# Patient Record
Sex: Female | Born: 1995 | Race: White | Hispanic: No | Marital: Married | Smoking: Never smoker
Health system: Southern US, Community
[De-identification: ages and names within clinical notes are randomized; demographics above are authoritative.]

## PROBLEM LIST (undated history)

## (undated) DIAGNOSIS — Z789 Other specified health status: Secondary | ICD-10-CM

## (undated) HISTORY — PX: NO PAST SURGERIES: SHX2092

---
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
## 2014-03-24 ENCOUNTER — Emergency Department: Payer: Self-pay | Admitting: Emergency Medicine

## 2014-03-24 LAB — BASIC METABOLIC PANEL
Anion Gap: 6 — ABNORMAL LOW (ref 7–16)
BUN: 15 mg/dL (ref 9–21)
CHLORIDE: 107 mmol/L (ref 97–107)
CO2: 26 mmol/L — AB (ref 16–25)
CREATININE: 0.77 mg/dL (ref 0.60–1.30)
Calcium, Total: 9.1 mg/dL (ref 9.0–10.7)
EGFR (Non-African Amer.): >60
Glucose: 96 mg/dL (ref 65–99)
OSMOLALITY: 278 (ref 275–301)
POTASSIUM: 3.8 mmol/L (ref 3.3–4.7)
Sodium: 139 mmol/L (ref 132–141)

## 2014-03-24 LAB — CBC WITH DIFFERENTIAL/PLATELET
Basophil #: 0.1 10*3/uL (ref 0.0–0.1)
Basophil %: 0.4 %
EOS ABS: 0.1 10*3/uL (ref 0.0–0.7)
EOS PCT: 0.5 %
HCT: 41.7 % (ref 35.0–47.0)
HGB: 14 g/dL (ref 12.0–16.0)
LYMPHS PCT: 14.3 %
Lymphocyte #: 1.9 10*3/uL (ref 1.0–3.6)
MCH: 29.7 pg (ref 26.0–34.0)
MCHC: 33.6 g/dL (ref 32.0–36.0)
MCV: 88 fL (ref 80–100)
MONO ABS: 0.5 x10 3/mm (ref 0.2–0.9)
MONOS PCT: 3.9 %
NEUTROS ABS: 10.6 10*3/uL — AB (ref 1.4–6.5)
Neutrophil %: 80.9 %
PLATELETS: 256 10*3/uL (ref 150–440)
RBC: 4.72 10*6/uL (ref 3.80–5.20)
RDW: 13.4 % (ref 11.5–14.5)
WBC: 13.1 10*3/uL — AB (ref 3.6–11.0)

## 2014-03-24 LAB — URINALYSIS, COMPLETE
BACTERIA: NEGATIVE
BILIRUBIN, UR: NEGATIVE
Glucose,UR: NEGATIVE mg/dL (ref 0–75)
Ketone: NEGATIVE
Leukocyte Esterase: NEGATIVE
NITRITE: NEGATIVE
Ph: 6 (ref 4.5–8.0)
Protein: NEGATIVE
SPECIFIC GRAVITY: 1.02 (ref 1.003–1.030)

## 2014-04-11 ENCOUNTER — Emergency Department: Payer: Self-pay | Admitting: Emergency Medicine

## 2014-04-11 LAB — URINALYSIS, COMPLETE
BILIRUBIN, UR: NEGATIVE
Bacteria: NONE SEEN
Blood: NEGATIVE
Glucose,UR: NEGATIVE mg/dL (ref 0–75)
Ketone: NEGATIVE
Leukocyte Esterase: NEGATIVE
NITRITE: NEGATIVE
Ph: 7 (ref 4.5–8.0)
Protein: NEGATIVE
Specific Gravity: 1.005 (ref 1.003–1.030)
WBC UR: 1 /HPF (ref 0–5)

## 2014-04-11 LAB — CBC WITH DIFFERENTIAL/PLATELET
Basophil #: 0 10*3/uL (ref 0.0–0.1)
Basophil %: 0.3 %
EOS ABS: 0 10*3/uL (ref 0.0–0.7)
EOS PCT: 0.3 %
HCT: 38.4 % (ref 35.0–47.0)
HGB: 13.1 g/dL (ref 12.0–16.0)
LYMPHS ABS: 0.7 10*3/uL — AB (ref 1.0–3.6)
LYMPHS PCT: 11.6 %
MCH: 29.3 pg (ref 26.0–34.0)
MCHC: 34 g/dL (ref 32.0–36.0)
MCV: 86 fL (ref 80–100)
MONO ABS: 0.4 x10 3/mm (ref 0.2–0.9)
MONOS PCT: 7.3 %
NEUTROS ABS: 4.9 10*3/uL (ref 1.4–6.5)
NEUTROS PCT: 80.5 %
Platelet: 173 10*3/uL (ref 150–440)
RBC: 4.46 10*6/uL (ref 3.80–5.20)
RDW: 12.9 % (ref 11.5–14.5)
WBC: 6.1 10*3/uL (ref 3.6–11.0)

## 2014-04-11 LAB — BASIC METABOLIC PANEL
ANION GAP: 7 (ref 7–16)
BUN: 9 mg/dL (ref 9–21)
CALCIUM: 9 mg/dL (ref 9.0–10.7)
CO2: 26 mmol/L — AB (ref 16–25)
Chloride: 100 mmol/L (ref 97–107)
Creatinine: 0.61 mg/dL (ref 0.60–1.30)
EGFR (African American): >60
GLUCOSE: 113 mg/dL — AB (ref 65–99)
Osmolality: 266 (ref 275–301)
POTASSIUM: 3.5 mmol/L (ref 3.3–4.7)
Sodium: 133 mmol/L (ref 132–141)

## 2014-04-13 ENCOUNTER — Emergency Department: Payer: Self-pay | Admitting: Internal Medicine

## 2014-11-17 ENCOUNTER — Emergency Department: Payer: Self-pay | Admitting: Emergency Medicine

## 2014-11-20 ENCOUNTER — Emergency Department: Payer: Self-pay | Admitting: Emergency Medicine

## 2014-12-04 ENCOUNTER — Emergency Department: Payer: Self-pay | Admitting: Emergency Medicine

## 2015-01-08 ENCOUNTER — Emergency Department: Payer: Self-pay | Admitting: Emergency Medicine

## 2015-01-11 ENCOUNTER — Emergency Department: Payer: Self-pay | Admitting: Emergency Medicine

## 2016-03-06 ENCOUNTER — Ambulatory Visit
Admission: EM | Admit: 2016-03-06 | Discharge: 2016-03-06 | Disposition: A | Discharge status: Home / Self Care | Payer: BLUE CROSS/BLUE SHIELD | Attending: Family Medicine | Admitting: Family Medicine

## 2016-03-06 ENCOUNTER — Encounter: Payer: Self-pay | Admitting: *Deleted

## 2016-03-06 DIAGNOSIS — H109 Unspecified conjunctivitis: Secondary | ICD-10-CM | POA: Diagnosis not present

## 2016-03-06 MED ORDER — GENTAMICIN SULFATE 0.3 % OP SOLN: 2.0000 [drp] | Freq: Four times a day (QID) | OPHTHALMIC | Status: DC

## 2016-03-06 NOTE — ED Notes (Signed)
Awoke with left eye matted this am. Left eye is itching and red. Pt states had pink eye a year ago and "this feels like pink eye".

## 2016-03-06 NOTE — Discharge Instructions (Signed)

## 2016-03-06 NOTE — ED Provider Notes (Signed)
CSN: [id number]     Arrival date & time 03/06/16  1505 History   First MD Initiated Contact with Patient 03/06/16 1619     Chief Complaint  Patient presents with  . Eye Drainage   HPI  Nichole Little is a pleasant 20 y.o. female who presents with acute red left eye. She states yesterday around 5 PM she noticed her left eye redness. She denies any known injury or visual changes. She denies any ill contacts. It is itchy. She did have yellow crusting within this point. She denies any sneezing, sinus pressure, personal drip, ear pain or fever.  History reviewed. No pertinent past medical history. History reviewed. No pertinent past surgical history. History reviewed. No pertinent family history. Social History  Substance Use Topics  . Smoking status: Never Smoker   . Smokeless tobacco: None  . Alcohol Use: No   OB History    No data available     Review of Systems  Constitutional: Negative.   HENT: Negative.   Eyes: Positive for pain, discharge, redness and itching. Negative for photophobia and visual disturbance.  Respiratory: Negative.   Cardiovascular: Negative.   Gastrointestinal: Negative.   Genitourinary: Negative.   Musculoskeletal: Negative.   Skin: Negative.   Neurological: Negative.   Psychiatric/Behavioral: Negative.     Allergies  Benadryl and Metronidazole and related  Home Medications   Prior to Admission medications   Medication Sig Start Date End Date Taking? Authorizing Provider  ibuprofen (ADVIL,MOTRIN) 200 MG tablet Take 600 mg by mouth once as needed for pain.   Yes Historical Provider, MD  etonogestrel-ethinyl estradiol (NUVARING) 0.12-0.015 MG/24HR vaginal ring Place 1 each vaginally every 28 (twenty-eight) days. Insert vaginally and leave in place for 3 consecutive weeks, then remove for 1 week.    Historical Provider, MD  gentamicin (GARAMYCIN) 0.3 % ophthalmic solution Place 2 drops into the left eye 4 (four) times daily. 03/06/16   Joselyn ArrowKandice L Berl Bonfanti,  NP   Meds Ordered and Administered this Visit  Medications - No data to display  BP 110/59 mmHg  Pulse 54  Temp(Src) 97.9 F (36.6 C) (Oral)  Resp 16  Ht 5\' 3"  (1.6 m)  Wt 145 lb (65.772 kg)  BMI 25.69 kg/m2  SpO2 100% No data found.   Physical Exam  Constitutional: She is oriented to person, place, and time. She appears well-developed and well-nourished. No distress.  HENT:  Head: Normocephalic and atraumatic.  Eyes: Lids are normal. Pupils are equal, round, and reactive to light. Left conjunctiva is injected. No scleral icterus.    Neck: Normal range of motion.  Cardiovascular: Normal rate and regular rhythm.   Pulmonary/Chest: Effort normal and breath sounds normal. No respiratory distress.  Abdominal: Soft. Bowel sounds are normal. She exhibits no distension.  Musculoskeletal: Normal range of motion. She exhibits no edema or tenderness.  Neurological: She is alert and oriented to person, place, and time. No cranial nerve deficit.  Skin: Skin is warm and dry. No rash noted. No erythema.  Psychiatric: Her behavior is normal. Judgment normal.  Nursing note and vitals reviewed.   ED Course  Procedures n/a Visual Acuity Review  Right Eye Distance: 20/20 Left Eye Distance: 20/50 Bilateral Distance: 20/20  Right Eye Near:   Left Eye Near:    Bilateral Near:     MDM   1. Conjunctivitis of left eye    Plan: diagnosis reviewed with patient/parent/guardian/caregiver  Rx as per orders;  benefits, risks, potential side effects reviewed  Recommend  supportive treatment with Infection and spread precautions Seek additional medical care if symptoms worsen or are not improving     Joselyn Arrow, NP 03/06/16 1635

## 2016-04-17 ENCOUNTER — Emergency Department
Admission: EM | Admit: 2016-04-17 | Discharge: 2016-04-17 | Disposition: A | Discharge status: Home / Self Care | Payer: BLUE CROSS/BLUE SHIELD | Attending: Student | Admitting: Student

## 2016-04-17 ENCOUNTER — Encounter: Payer: Self-pay | Admitting: Emergency Medicine

## 2016-04-17 DIAGNOSIS — Y929 Unspecified place or not applicable: Secondary | ICD-10-CM | POA: Diagnosis not present

## 2016-04-17 DIAGNOSIS — Y999 Unspecified external cause status: Secondary | ICD-10-CM | POA: Diagnosis not present

## 2016-04-17 DIAGNOSIS — S39012A Strain of muscle, fascia and tendon of lower back, initial encounter: Secondary | ICD-10-CM | POA: Diagnosis not present

## 2016-04-17 DIAGNOSIS — Y939 Activity, unspecified: Secondary | ICD-10-CM | POA: Diagnosis not present

## 2016-04-17 DIAGNOSIS — X501XXA Overexertion from prolonged static or awkward postures, initial encounter: Secondary | ICD-10-CM | POA: Insufficient documentation

## 2016-04-17 DIAGNOSIS — M545 Low back pain: Secondary | ICD-10-CM | POA: Diagnosis present

## 2016-04-17 LAB — URINALYSIS COMPLETE WITH MICROSCOPIC (ARMC ONLY)
BILIRUBIN URINE: NEGATIVE
GLUCOSE, UA: NEGATIVE mg/dL
HGB URINE DIPSTICK: NEGATIVE
KETONES UR: NEGATIVE mg/dL
LEUKOCYTES UA: NEGATIVE
Nitrite: NEGATIVE
PH: 6 (ref 5.0–8.0)
Protein, ur: NEGATIVE mg/dL
Specific Gravity, Urine: 1.019 (ref 1.005–1.030)

## 2016-04-17 MED ORDER — CYCLOBENZAPRINE HCL 5 MG PO TABS: 5.0000 mg | ORAL_TABLET | Freq: Three times a day (TID) | ORAL | Status: DC | PRN

## 2016-04-17 MED ORDER — KETOROLAC TROMETHAMINE 60 MG/2ML IM SOLN
30.0000 mg | Freq: Once | INTRAMUSCULAR | Status: AC
  Administered 2016-04-17: 30 mg via INTRAMUSCULAR
  Filled 2016-04-17: qty 2

## 2016-04-17 MED ORDER — KETOROLAC TROMETHAMINE 10 MG PO TABS: 10.0000 mg | ORAL_TABLET | Freq: Three times a day (TID) | ORAL | Status: DC

## 2016-04-17 NOTE — Discharge Instructions (Signed)
Low Back Sprain With Rehab A sprain is an injury in which a ligament is torn. The ligaments of the lower back are vulnerable to sprains. However, they are strong and require great force to be injured. These ligaments are important for stabilizing the spinal column. Sprains are classified into three categories. Grade 1 sprains cause pain, but the tendon is not lengthened. Grade 2 sprains include a lengthened ligament, due to the ligament being stretched or partially ruptured. With grade 2 sprains there is still function, although the function may be decreased. Grade 3 sprains involve a complete tear of the tendon or muscle, and function is usually impaired. SYMPTOMS   Severe pain in the lower back.  Sometimes, a feeling of a "pop," "snap," or tear, at the time of injury.  Tenderness and sometimes swelling at the injury site.  Uncommonly, bruising (contusion) within 48 hours of injury.  Muscle spasms in the back. CAUSES  Low back sprains occur when a force is placed on the ligaments that is greater than they can handle. Common causes of injury include:  Performing a stressful act while off-balance.  Repetitive stressful activities that involve movement of the lower back.  Direct hit (trauma) to the lower back. RISK INCREASES WITH:  Contact sports (football, wrestling).  Collisions (major skiing accidents).  Sports that require throwing or lifting (baseball, weightlifting).  Sports involving twisting of the spine (gymnastics, diving, tennis, golf).  Poor strength and flexibility.  Inadequate protection.  Previous back injury or surgery (especially fusion). PREVENTION  Wear properly fitted and padded protective equipment.  Warm up and stretch properly before activity.  Allow for adequate recovery between workouts.  Maintain physical fitness:  Strength, flexibility, and endurance.  Cardiovascular fitness.  Maintain a healthy body weight. PROGNOSIS  If treated properly,  low back sprains usually heal with non-surgical treatment. The length of time for healing depends on the severity of the injury.  RELATED COMPLICATIONS   Recurring symptoms, resulting in a chronic problem.  Chronic inflammation and pain in the low back.  Delayed healing or resolution of symptoms, especially if activity is resumed too soon.  Prolonged impairment.  Unstable or arthritic joints of the low back. TREATMENT  Treatment first involves the use of ice and medicine, to reduce pain and inflammation. The use of strengthening and stretching exercises may help reduce pain with activity. These exercises may be performed at home or with a therapist. Severe injuries may require referral to a therapist for further evaluation and treatment, such as ultrasound. Your caregiver may advise that you wear a back brace or corset, to help reduce pain and discomfort. Often, prolonged bed rest results in greater harm then benefit. Corticosteroid injections may be recommended. However, these should be reserved for the most serious cases. It is important to avoid using your back when lifting objects. At night, sleep on your back on a firm mattress, with a pillow placed under your knees. If non-surgical treatment is unsuccessful, surgery may be needed.  MEDICATION   If pain medicine is needed, nonsteroidal anti-inflammatory medicines (aspirin and ibuprofen), or other minor pain relievers (acetaminophen), are often advised.  Do not take pain medicine for 7 days before surgery.  Prescription pain relievers may be given, if your caregiver thinks they are needed. Use only as directed and only as much as you need.  Ointments applied to the skin may be helpful.  Corticosteroid injections may be given by your caregiver. These injections should be reserved for the most serious cases, because  they may only be given a certain number of times. HEAT AND COLD  Cold treatment (icing) should be applied for 10 to 15  minutes every 2 to 3 hours for inflammation and pain, and immediately after activity that aggravates your symptoms. Use ice packs or an ice massage.  Heat treatment may be used before performing stretching and strengthening activities prescribed by your caregiver, physical therapist, or athletic trainer. Use a heat pack or a warm water soak. SEEK MEDICAL CARE IF:   Symptoms get worse or do not improve in 2 to 4 weeks, despite treatment.  You develop numbness or weakness in either leg.  You lose bowel or bladder function.  Any of the following occur after surgery: fever, increased pain, swelling, redness, drainage of fluids, or bleeding in the affected area.  New, unexplained symptoms develop. (Drugs used in treatment may produce side effects.) EXERCISES  RANGE OF MOTION (ROM) AND STRETCHING EXERCISES - Low Back Sprain Most people with lower back pain will find that their symptoms get worse with excessive bending forward (flexion) or arching at the lower back (extension). The exercises that will help resolve your symptoms will focus on the opposite motion.  Your physician, physical therapist or athletic trainer will help you determine which exercises will be most helpful to resolve your lower back pain. Do not complete any exercises without first consulting with your caregiver. Discontinue any exercises which make your symptoms worse, until you speak to your caregiver. If you have pain, numbness or tingling which travels down into your buttocks, leg or foot, the goal of the therapy is for these symptoms to move closer to your back and eventually resolve. Sometimes, these leg symptoms will get better, but your lower back pain may worsen. This is often an indication of progress in your rehabilitation. Be very alert to any changes in your symptoms and the activities in which you participated in the 24 hours prior to the change. Sharing this information with your caregiver will allow him or her to most  efficiently treat your condition. These exercises may help you when beginning to rehabilitate your injury. Your symptoms may resolve with or without further involvement from your physician, physical therapist or athletic trainer. While completing these exercises, remember:   Restoring tissue flexibility helps normal motion to return to the joints. This allows healthier, less painful movement and activity.  An effective stretch should be held for at least 30 seconds.  A stretch should never be painful. You should only feel a gentle lengthening or release in the stretched tissue. FLEXION RANGE OF MOTION AND STRETCHING EXERCISES: STRETCH - Flexion, Single Knee to Chest   Lie on a firm bed or floor with both legs extended in front of you.  Keeping one leg in contact with the floor, bring your opposite knee to your chest. Hold your leg in place by either grabbing behind your thigh or at your knee.  Pull until you feel a gentle stretch in your low back. Hold __________ seconds.  Slowly release your grasp and repeat the exercise with the opposite side. Repeat __________ times. Complete this exercise __________ times per day.  STRETCH - Flexion, Double Knee to Chest  Lie on a firm bed or floor with both legs extended in front of you.  Keeping one leg in contact with the floor, bring your opposite knee to your chest.  Tense your stomach muscles to support your back and then lift your other knee to your chest. Hold your legs in  place by either grabbing behind your thighs or at your knees.  Pull both knees toward your chest until you feel a gentle stretch in your low back. Hold __________ seconds.  Tense your stomach muscles and slowly return one leg at a time to the floor. Repeat __________ times. Complete this exercise __________ times per day.  STRETCH - Low Trunk Rotation  Lie on a firm bed or floor. Keeping your legs in front of you, bend your knees so they are both pointed toward the  ceiling and your feet are flat on the floor.  Extend your arms out to the side. This will stabilize your upper body by keeping your shoulders in contact with the floor.  Gently and slowly drop both knees together to one side until you feel a gentle stretch in your low back. Hold for __________ seconds.  Tense your stomach muscles to support your lower back as you bring your knees back to the starting position. Repeat the exercise to the other side. Repeat __________ times. Complete this exercise __________ times per day  EXTENSION RANGE OF MOTION AND FLEXIBILITY EXERCISES: STRETCH - Extension, Prone on Elbows   Lie on your stomach on the floor, a bed will be too soft. Place your palms about shoulder width apart and at the height of your head.  Place your elbows under your shoulders. If this is too painful, stack pillows under your chest.  Allow your body to relax so that your hips drop lower and make contact more completely with the floor.  Hold this position for __________ seconds.  Slowly return to lying flat on the floor. Repeat __________ times. Complete this exercise __________ times per day.  RANGE OF MOTION - Extension, Prone Press Ups  Lie on your stomach on the floor, a bed will be too soft. Place your palms about shoulder width apart and at the height of your head.  Keeping your back as relaxed as possible, slowly straighten your elbows while keeping your hips on the floor. You may adjust the placement of your hands to maximize your comfort. As you gain motion, your hands will come more underneath your shoulders.  Hold this position __________ seconds.  Slowly return to lying flat on the floor. Repeat __________ times. Complete this exercise __________ times per day.  RANGE OF MOTION- Quadruped, Neutral Spine   Assume a hands and knees position on a firm surface. Keep your hands under your shoulders and your knees under your hips. You may place padding under your knees for  comfort.  Drop your head and point your tailbone toward the ground below you. This will round out your lower back like an angry cat. Hold this position for __________ seconds.  Slowly lift your head and release your tail bone so that your back sags into a large arch, like an old horse.  Hold this position for __________ seconds.  Repeat this until you feel limber in your low back.  Now, find your "sweet spot." This will be the most comfortable position somewhere between the two previous positions. This is your neutral spine. Once you have found this position, tense your stomach muscles to support your low back.  Hold this position for __________ seconds. Repeat __________ times. Complete this exercise __________ times per day.  STRENGTHENING EXERCISES - Low Back Sprain These exercises may help you when beginning to rehabilitate your injury. These exercises should be done near your "sweet spot." This is the neutral, low-back arch, somewhere between fully rounded and  fully arched, that is your least painful position. When performed in this safe range of motion, these exercises can be used for people who have either a flexion or extension based injury. These exercises may resolve your symptoms with or without further involvement from your physician, physical therapist or athletic trainer. While completing these exercises, remember:   Muscles can gain both the endurance and the strength needed for everyday activities through controlled exercises.  Complete these exercises as instructed by your physician, physical therapist or athletic trainer. Increase the resistance and repetitions only as guided.  You may experience muscle soreness or fatigue, but the pain or discomfort you are trying to eliminate should never worsen during these exercises. If this pain does worsen, stop and make certain you are following the directions exactly. If the pain is still present after adjustments, discontinue the  exercise until you can discuss the trouble with your caregiver. STRENGTHENING - Deep Abdominals, Pelvic Tilt   Lie on a firm bed or floor. Keeping your legs in front of you, bend your knees so they are both pointed toward the ceiling and your feet are flat on the floor.  Tense your lower abdominal muscles to press your low back into the floor. This motion will rotate your pelvis so that your tail bone is scooping upwards rather than pointing at your feet or into the floor. With a gentle tension and even breathing, hold this position for __________ seconds. Repeat __________ times. Complete this exercise __________ times per day.  STRENGTHENING - Abdominals, Crunches   Lie on a firm bed or floor. Keeping your legs in front of you, bend your knees so they are both pointed toward the ceiling and your feet are flat on the floor. Cross your arms over your chest.  Slightly tip your chin down without bending your neck.  Tense your abdominals and slowly lift your trunk high enough to just clear your shoulder blades. Lifting higher can put excessive stress on the lower back and does not further strengthen your abdominal muscles.  Control your return to the starting position. Repeat __________ times. Complete this exercise __________ times per day.  STRENGTHENING - Quadruped, Opposite UE/LE Lift   Assume a hands and knees position on a firm surface. Keep your hands under your shoulders and your knees under your hips. You may place padding under your knees for comfort.  Find your neutral spine and gently tense your abdominal muscles so that you can maintain this position. Your shoulders and hips should form a rectangle that is parallel with the floor and is not twisted.  Keeping your trunk steady, lift your right hand no higher than your shoulder and then your left leg no higher than your hip. Make sure you are not holding your breath. Hold this position for __________ seconds.  Continuing to keep  your abdominal muscles tense and your back steady, slowly return to your starting position. Repeat with the opposite arm and leg. Repeat __________ times. Complete this exercise __________ times per day.  STRENGTHENING - Abdominals and Quadriceps, Straight Leg Raise   Lie on a firm bed or floor with both legs extended in front of you.  Keeping one leg in contact with the floor, bend the other knee so that your foot can rest flat on the floor.  Find your neutral spine, and tense your abdominal muscles to maintain your spinal position throughout the exercise.  Slowly lift your straight leg off the floor about 6 inches for a count of  15, making sure to not hold your breath.  Still keeping your neutral spine, slowly lower your leg all the way to the floor. Repeat this exercise with each leg __________ times. Complete this exercise __________ times per day. POSTURE AND BODY MECHANICS CONSIDERATIONS - Low Back Sprain Keeping correct posture when sitting, standing or completing your activities will reduce the stress put on different body tissues, allowing injured tissues a chance to heal and limiting painful experiences. The following are general guidelines for improved posture. Your physician or physical therapist will provide you with any instructions specific to your needs. While reading these guidelines, remember:  The exercises prescribed by your provider will help you have the flexibility and strength to maintain correct postures.  The correct posture provides the best environment for your joints to work. All of your joints have less wear and tear when properly supported by a spine with good posture. This means you will experience a healthier, less painful body.  Correct posture must be practiced with all of your activities, especially prolonged sitting and standing. Correct posture is as important when doing repetitive low-stress activities (typing) as it is when doing a single heavy-load  activity (lifting). RESTING POSITIONS Consider which positions are most painful for you when choosing a resting position. If you have pain with flexion-based activities (sitting, bending, stooping, squatting), choose a position that allows you to rest in a less flexed posture. You would want to avoid curling into a fetal position on your side. If your pain worsens with extension-based activities (prolonged standing, working overhead), avoid resting in an extended position such as sleeping on your stomach. Most people will find more comfort when they rest with their spine in a more neutral position, neither too rounded nor too arched. Lying on a non-sagging bed on your side with a pillow between your knees, or on your back with a pillow under your knees will often provide some relief. Keep in mind, being in any one position for a prolonged period of time, no matter how correct your posture, can still lead to stiffness. PROPER SITTING POSTURE In order to minimize stress and discomfort on your spine, you must sit with correct posture. Sitting with good posture should be effortless for a healthy body. Returning to good posture is a gradual process. Many people can work toward this most comfortably by using various supports until they have the flexibility and strength to maintain this posture on their own. When sitting with proper posture, your ears will fall over your shoulders and your shoulders will fall over your hips. You should use the back of the chair to support your upper back. Your lower back will be in a neutral position, just slightly arched. You may place a small pillow or folded towel at the base of your lower back for  support.  When working at a desk, create an environment that supports good, upright posture. Without extra support, muscles tire, which leads to excessive strain on joints and other tissues. Keep these recommendations in mind: CHAIR:  A chair should be able to slide under your desk  when your back makes contact with the back of the chair. This allows you to work closely.  The chair's height should allow your eyes to be level with the upper part of your monitor and your hands to be slightly lower than your elbows. BODY POSITION  Your feet should make contact with the floor. If this is not possible, use a foot rest.  Keep your ears  over your shoulders. This will reduce stress on your neck and low back. INCORRECT SITTING POSTURES  If you are feeling tired and unable to assume a healthy sitting posture, do not slouch or slump. This puts excessive strain on your back tissues, causing more damage and pain. Healthier options include:  Using more support, like a lumbar pillow.  Switching tasks to something that requires you to be upright or walking.  Talking a brief walk.  Lying down to rest in a neutral-spine position. PROLONGED STANDING WHILE SLIGHTLY LEANING FORWARD  When completing a task that requires you to lean forward while standing in one place for a long time, place either foot up on a stationary 2-4 inch high object to help maintain the best posture. When both feet are on the ground, the lower back tends to lose its slight inward curve. If this curve flattens (or becomes too large), then the back and your other joints will experience too much stress, tire more quickly, and can cause pain. CORRECT STANDING POSTURES Proper standing posture should be assumed with all daily activities, even if they only take a few moments, like when brushing your teeth. As in sitting, your ears should fall over your shoulders and your shoulders should fall over your hips. You should keep a slight tension in your abdominal muscles to brace your spine. Your tailbone should point down to the ground, not behind your body, resulting in an over-extended swayback posture.  INCORRECT STANDING POSTURES  Common incorrect standing postures include a forward head, locked knees and/or an excessive  swayback. WALKING Walk with an upright posture. Your ears, shoulders and hips should all line-up. PROLONGED ACTIVITY IN A FLEXED POSITION When completing a task that requires you to bend forward at your waist or lean over a low surface, try to find a way to stabilize 3 out of 4 of your limbs. You can place a hand or elbow on your thigh or rest a knee on the surface you are reaching across. This will provide you more stability, so that your muscles do not tire as quickly. By keeping your knees relaxed, or slightly bent, you will also reduce stress across your lower back. CORRECT LIFTING TECHNIQUES DO :  Assume a wide stance. This will provide you more stability and the opportunity to get as close as possible to the object which you are lifting.  Tense your abdominals to brace your spine. Bend at the knees and hips. Keeping your back locked in a neutral-spine position, lift using your leg muscles. Lift with your legs, keeping your back straight.  Test the weight of unknown objects before attempting to lift them.  Try to keep your elbows locked down at your sides in order get the best strength from your shoulders when carrying an object.  Always ask for help when lifting heavy or awkward objects. INCORRECT LIFTING TECHNIQUES DO NOT:   Lock your knees when lifting, even if it is a small object.  Bend and twist. Pivot at your feet or move your feet when needing to change directions.  Assume that you can safely pick up even a paperclip without proper posture.   This information is not intended to replace advice given to you by your health care provider. Make sure you discuss any questions you have with your health care provider.   Document Released: 10/31/2005 Document Revised: 11/21/2014 Document Reviewed: 02/12/2009 [employer] Interactive Patient Education [employer]   Your exam is essentially normal today. You appear to have  a muscle strain. Take the prescription meds as directed.  Follow-up with your provider or Dr. Hyacinth Meeker for continued symptoms in a week or so. Monitor body mechanics and apply ice to the back as needed.

## 2016-04-17 NOTE — ED Notes (Signed)
Pt states back pain started about 2 weeks ago. Lower back pain.  Pain was worse last night while at work. Pt did not hurt back at work however.  Taking ibuprofen at home for pain.

## 2016-04-17 NOTE — ED Notes (Signed)
Developed lower back pain about 2 weeks ago  States pain is non radiating and worse with lifting  Has had relief with ibu until yesterday

## 2016-04-17 NOTE — ED Provider Notes (Signed)
[facility] Emergency Department Provider Note ____________________________________________  Time seen: 1248  I have reviewed the triage vital signs and the nursing notes.  HISTORY  Chief Complaint  Back Pain  HPI Nichole Little is a 20 y.o. female visits to the ED for evaluation of left low back pain intermittently for the last 2 weeks. She describes initially the pain was symmetric across the midline of the lower back. Now last 24-40 hours the pain appears to be localized to the left lumbar sacral region. She denies any dysuria, frequency, hematuria. She describes the pain was worse and significantly yesterday while at work. She describes the pain is increased with prolonged sitting and walking. She denies any distal paresthesias, bladder or bowel incontinence, or foot drop. She has taken ibuprofen intermittently with limited benefit.She rates her overall discomfort a 4/10 in triage.  History reviewed. No pertinent past medical history.  There are no active problems to display for this patient.  History reviewed. No pertinent past surgical history.  Current Outpatient Rx  Name  Route  Sig  Dispense  Refill  . cyclobenzaprine (FLEXERIL) 5 MG tablet   Oral   Take 1 tablet (5 mg total) by mouth every 8 (eight) hours as needed for muscle spasms.   12 tablet   0   . etonogestrel-ethinyl estradiol (NUVARING) 0.12-0.015 MG/24HR vaginal ring   Vaginal   Place 1 each vaginally every 28 (twenty-eight) days. Insert vaginally and leave in place for 3 consecutive weeks, then remove for 1 week.         Marland Kitchen. gentamicin (GARAMYCIN) 0.3 % ophthalmic solution   Left Eye   Place 2 drops into the left eye 4 (four) times daily.   5 mL   0   . ibuprofen (ADVIL,MOTRIN) 200 MG tablet   Oral   Take 600 mg by mouth once as needed for pain.         Marland Kitchen. ketorolac (TORADOL) 10 MG tablet   Oral   Take 1 tablet (10 mg total) by mouth every 8 (eight) hours.   15 tablet   0     Allergies Benadryl and Metronidazole and related  History reviewed. No pertinent family history.  Social History Social History  Substance Use Topics  . Smoking status: Never Smoker   . Smokeless tobacco: None  . Alcohol Use: No   Review of Systems  Constitutional: Negative for fever. Gastrointestinal: Negative for abdominal pain, vomiting and diarrhea. Genitourinary: Negative for dysuria. Musculoskeletal: Positive for back pain. Skin: Negative for rash. Neurological: Negative for headaches, focal weakness or numbness. ____________________________________________  PHYSICAL EXAM:  VITAL SIGNS: ED Triage Vitals  Enc Vitals Group     BP 04/17/16 1230 102/60 mmHg     Pulse Rate 04/17/16 1230 60     Resp --      Temp 04/17/16 1230 97.8 F (36.6 C)     Temp Source 04/17/16 1230 Oral     SpO2 04/17/16 1230 98 %     Weight 04/17/16 1230 145 lb (65.772 kg)     Height 04/17/16 1230 5\' 3"  (1.6 m)     Head Cir --      Peak Flow --      Pain Score 04/17/16 1233 4     Pain Loc --      Pain Edu? --      Excl. in GC? --    Constitutional: Alert and oriented. Well appearing and in no distress. Head: Normocephalic and atraumatic. Cardiovascular:  Normal rate, regular rhythm.  Respiratory: Normal respiratory effort. No wheezes/rales/rhonchi. Gastrointestinal: Soft and nontender. No distention, Rebound, guarding, organomegaly. No CVA tenderness is appreciated. Musculoskeletal: Normal spinal on it without midline tenderness, spasm, deformity or step-off. Patient with normal transition from sit to stand. She has full lumbar flexion and extension range and a negative Trendelenburg. Patient with normal and negative straight leg raise the supine position. Nontender with normal range of motion in all extremities.  Neurologic: Nerves II through XII grossly intact. Normal LE DTRs bilaterally. Normal gait without ataxia. Normal speech and language. No gross focal neurologic deficits are  appreciated. Skin:  Skin is warm, dry and intact. No rash noted. ____________________________________________ LABORATORY   Labs Reviewed  URINALYSIS COMPLETEWITH MICROSCOPIC ([facility] ONLY) - Abnormal; Notable for the following:    Color, Urine YELLOW (*)    APPearance CLEAR (*)    Bacteria, UA RARE (*)    Squamous Epithelial / LPF 0-5 (*)    All other components within normal limits  ____________________________________________ PROCEDURES  Toradol 30 mg PO ____________________________________________  INITIAL IMPRESSION / ASSESSMENT AND PLAN / ED COURSE  Patient what appears to be an acute lumbar sacral strain on the left side. She otherwise appears to be neurologically intact. No evidence of an asymmetric bacteriuria. She'll be discharged with a prescription for Flexeril and ketorolac to dose as directed. She is also provided with a work note holding her out of work for tomorrow. She should apply ice and follow-up with primary care provider for ongoing symptom management. ____________________________________________  FINAL CLINICAL IMPRESSION(S) / ED DIAGNOSES  Final diagnoses:  Lumbosacral strain, initial encounter      Lissa Hoard, PA-C 04/17/16 1453  Gayla Doss, MD 04/17/16 [phone]

## 2016-04-25 ENCOUNTER — Emergency Department: Payer: BLUE CROSS/BLUE SHIELD

## 2016-04-25 ENCOUNTER — Emergency Department
Admission: EM | Admit: 2016-04-25 | Discharge: 2016-04-25 | Disposition: A | Discharge status: Home / Self Care | Payer: BLUE CROSS/BLUE SHIELD | Attending: Emergency Medicine | Admitting: Emergency Medicine

## 2016-04-25 ENCOUNTER — Encounter: Payer: Self-pay | Admitting: Emergency Medicine

## 2016-04-25 DIAGNOSIS — Y990 Civilian activity done for income or pay: Secondary | ICD-10-CM | POA: Insufficient documentation

## 2016-04-25 DIAGNOSIS — Y92512 Supermarket, store or market as the place of occurrence of the external cause: Secondary | ICD-10-CM | POA: Insufficient documentation

## 2016-04-25 DIAGNOSIS — S39012A Strain of muscle, fascia and tendon of lower back, initial encounter: Secondary | ICD-10-CM | POA: Diagnosis not present

## 2016-04-25 DIAGNOSIS — Y93F2 Activity, caregiving, lifting: Secondary | ICD-10-CM | POA: Diagnosis not present

## 2016-04-25 DIAGNOSIS — X500XXA Overexertion from strenuous movement or load, initial encounter: Secondary | ICD-10-CM | POA: Insufficient documentation

## 2016-04-25 DIAGNOSIS — M545 Low back pain: Secondary | ICD-10-CM | POA: Diagnosis present

## 2016-04-25 LAB — URINALYSIS COMPLETE WITH MICROSCOPIC (ARMC ONLY)
BILIRUBIN URINE: NEGATIVE
Bacteria, UA: NONE SEEN
GLUCOSE, UA: NEGATIVE mg/dL
Ketones, ur: NEGATIVE mg/dL
Leukocytes, UA: NEGATIVE
NITRITE: NEGATIVE
PH: 5 (ref 5.0–8.0)
Protein, ur: 30 mg/dL — AB
SPECIFIC GRAVITY, URINE: 1.031 — AB (ref 1.005–1.030)

## 2016-04-25 LAB — POCT PREGNANCY, URINE: PREG TEST UR: NEGATIVE

## 2016-04-25 MED ORDER — DIAZEPAM 5 MG PO TABS
5.0000 mg | ORAL_TABLET | Freq: Once | ORAL | Status: AC
  Administered 2016-04-25: 5 mg via ORAL
  Filled 2016-04-25: qty 1

## 2016-04-25 MED ORDER — IBUPROFEN 800 MG PO TABS
800.0000 mg | ORAL_TABLET | Freq: Once | ORAL | Status: AC
  Administered 2016-04-25: 800 mg via ORAL
  Filled 2016-04-25: qty 1

## 2016-04-25 MED ORDER — BACLOFEN 10 MG PO TABS: 5.0000 mg | ORAL_TABLET | Freq: Three times a day (TID) | ORAL | Status: DC

## 2016-04-25 MED ORDER — NAPROXEN 500 MG PO TABS: 500.0000 mg | ORAL_TABLET | Freq: Two times a day (BID) | ORAL | Status: DC

## 2016-04-25 NOTE — ED Notes (Signed)
Patient presents to the ED with lower back pain x 1 month.  Patient seen in the ED for same previously.  Patient states she has been taking toradol and muscle relaxers recently that have helped somewhat.  Patient states pain feels worse today.  Patient denies dysuria or urinary frequency.  Patient reports increased pain with walking and movement.  Patient ambulatory to triage with slow gait.

## 2016-04-25 NOTE — ED Notes (Signed)
See triage note   States she was seen about 8 days ago with same sx's  States pain was better until last pm  And this am pain is worse  Ambulates slowly d/t pain. States she was doing better while taking meds

## 2016-04-25 NOTE — Discharge Instructions (Signed)

## 2016-04-25 NOTE — ED Provider Notes (Signed)
[facility] Emergency Department Provider Note  ____________________________________________  Time seen: Approximately 9:01 AM  I have reviewed the triage vital signs and the nursing notes.   HISTORY  Chief Complaint Back Pain    HPI Nichole Little is a 20 y.o. female presents for evaluation of low back pain. Patient states that she was seen here about 8 days ago for the same symptoms pain was better until last night. Pain is worse again this morning. Currently on no medications. Denies any dysuria or urinary frequency. Pain is worsened with walking and movement. Patient works in [employer] and does a lot of bending and lifting.   History reviewed. No pertinent past medical history.  There are no active problems to display for this patient.   History reviewed. No pertinent past surgical history.  Current Outpatient Rx  Name  Route  Sig  Dispense  Refill  . baclofen (LIORESAL) 10 MG tablet   Oral   Take 0.5 tablets (5 mg total) by mouth 3 (three) times daily.   30 tablet   0   . etonogestrel-ethinyl estradiol (NUVARING) 0.12-0.015 MG/24HR vaginal ring   Vaginal   Place 1 each vaginally every 28 (twenty-eight) days. Insert vaginally and leave in place for 3 consecutive weeks, then remove for 1 week.         Marland Kitchen. ibuprofen (ADVIL,MOTRIN) 200 MG tablet   Oral   Take 600 mg by mouth once as needed for pain.         . naproxen (NAPROSYN) 500 MG tablet   Oral   Take 1 tablet (500 mg total) by mouth 2 (two) times daily with a meal.   60 tablet   0     Allergies Benadryl and Metronidazole and related  No family history on file.  Social History Social History  Substance Use Topics  . Smoking status: Never Smoker   . Smokeless tobacco: None  . Alcohol Use: No    Review of Systems Constitutional: No fever/chills Eyes: No visual changes. ENT: No sore throat. Cardiovascular: Denies chest pain. Respiratory: Denies shortness of  breath. Gastrointestinal: No abdominal pain.  No nausea, no vomiting.  No diarrhea.  No constipation. Genitourinary: Negative for dysuria. Musculoskeletal: Positive for low back pain. Skin: Negative for rash. Neurological: Negative for headaches, focal weakness or numbness.  10-point ROS otherwise negative.  ____________________________________________   PHYSICAL EXAM:  VITAL SIGNS: ED Triage Vitals  Enc Vitals Group     BP 04/25/16 0849 117/66 mmHg     Pulse Rate 04/25/16 0849 72     Resp 04/25/16 0849 18     Temp 04/25/16 0849 98.3 F (36.8 C)     Temp Source 04/25/16 0849 Oral     SpO2 04/25/16 0849 99 %     Weight 04/25/16 0849 145 lb (65.772 kg)     Height 04/25/16 0849 5\' 3"  (1.6 m)     Head Cir --      Peak Flow --      Pain Score 04/25/16 0849 8     Pain Loc --      Pain Edu? --      Excl. in GC? --     Constitutional: Alert and oriented. Well appearing and in no acute distress. Cardiovascular: Normal rate, regular rhythm. Grossly normal heart sounds.  Good peripheral circulation. Respiratory: Normal respiratory effort.  No retractions. Lungs CTAB. Musculoskeletal: No lower extremity tenderness nor edema.  No joint effusions.Trachea leg raise negative bilaterally point  tenderness noted to the lumbar paraspinal region. Neurologic:  Normal speech and language. No gross focal neurologic deficits are appreciated. No gait instability. Skin:  Skin is warm, dry and intact. No rash noted. Psychiatric: Mood and affect are normal. Speech and behavior are normal.  ____________________________________________   LABS (all labs ordered are listed, but only abnormal results are displayed)  Labs Reviewed  URINALYSIS COMPLETEWITH MICROSCOPIC ([facility] ONLY) - Abnormal; Notable for the following:    Color, Urine YELLOW (*)    APPearance HAZY (*)    Specific Gravity, Urine 1.031 (*)    Hgb urine dipstick 2+ (*)    Protein, ur 30 (*)    Squamous Epithelial / LPF 0-5 (*)    All  other components within normal limits  POC URINE PREG, ED  POCT PREGNANCY, URINE   ____________________________________________  EKG   ____________________________________________  RADIOLOGY  HEENT acute osseous findings. ____________________________________________   PROCEDURES  Procedure(s) performed: None  Critical Care performed: No  ____________________________________________   INITIAL IMPRESSION / ASSESSMENT AND PLAN / ED COURSE  Pertinent labs & imaging results that were available during my care of the patient were reviewed by me and considered in my medical decision making (see chart for details).  Acute lumbar strain. Rx given for Naprosyn 500 mg twice a day and baclofen 5 mg 3 times a day. Patient follow-up PCP or return to ER as needed. ____________________________________________   FINAL CLINICAL IMPRESSION(S) / ED DIAGNOSES  Final diagnoses:  Lumbar strain, initial encounter     This chart was dictated using voice recognition software/Dragon. Despite best efforts to proofread, errors can occur which can change the meaning. Any change was purely unintentional.   Evangeline Dakin, PA-C 04/25/16 1120  Jennye Moccasin, MD 04/25/16 [phone]

## 2017-06-26 DIAGNOSIS — R87612 Low grade squamous intraepithelial lesion on cytologic smear of cervix (LGSIL): Secondary | ICD-10-CM | POA: Insufficient documentation

## 2017-07-27 ENCOUNTER — Encounter: Payer: Self-pay | Admitting: *Deleted

## 2017-07-27 ENCOUNTER — Emergency Department
Admission: EM | Admit: 2017-07-27 | Discharge: 2017-07-27 | Disposition: A | Discharge status: Home / Self Care | Payer: BLUE CROSS/BLUE SHIELD | Attending: Student in an Organized Health Care Education/Training Program | Admitting: Student in an Organized Health Care Education/Training Program

## 2017-07-27 DIAGNOSIS — L739 Follicular disorder, unspecified: Secondary | ICD-10-CM | POA: Diagnosis not present

## 2017-07-27 DIAGNOSIS — R21 Rash and other nonspecific skin eruption: Secondary | ICD-10-CM | POA: Diagnosis present

## 2017-07-27 DIAGNOSIS — Z3A00 Weeks of gestation of pregnancy not specified: Secondary | ICD-10-CM | POA: Diagnosis not present

## 2017-07-27 MED ORDER — CEPHALEXIN 500 MG PO CAPS: 500.0000 mg | ORAL_CAPSULE | Freq: Three times a day (TID) | ORAL | 0 refills | Status: DC

## 2017-07-27 NOTE — ED Notes (Signed)
See triage note  States she developed a red rash to both cheeks yesterday  No itching or diff swallowing noted  Pt is 16 weeks preg and denies any vaginal sxs'

## 2017-07-27 NOTE — ED Triage Notes (Signed)
Pt is 3 months pregnant with rash on face starting yesterday, pt denies any other symptoms

## 2017-07-27 NOTE — Discharge Instructions (Signed)
Follow-up with her primary care doctor or one of the dermatologist listed on your discharge papers if not improving. Begin taking Keflex 500 mg 3 times a day for the next 7 days. Do not use any new creams to this area.

## 2017-07-27 NOTE — ED Provider Notes (Signed)
[facility] Emergency Department Provider Note  ____________________________________________   First MD Initiated Contact with Patient 07/27/17 1139     (approximate)  I have reviewed the triage vital signs and the nursing notes.   HISTORY  Chief Complaint Rash   HPI Nichole Little is a 21 y.o. female is here complaining of rash to her face that started yesterday. Patient denies any itching, fever, drainage. Patient is also 3 months pregnant. Patient denies any pain to her face.   History reviewed. No pertinent past medical history.  There are no active problems to display for this patient.   History reviewed. No pertinent surgical history.  Prior to Admission medications   Medication Sig Start Date End Date Taking? Authorizing Provider  baclofen (LIORESAL) 10 MG tablet Take 0.5 tablets (5 mg total) by mouth 3 (three) times daily. 04/25/16   Beers, Charmayne Sheer, PA-C  cephALEXin (KEFLEX) 500 MG capsule Take 1 capsule (500 mg total) by mouth 3 (three) times daily. 07/27/17   Tommi Rumps, PA-C  etonogestrel-ethinyl estradiol (NUVARING) 0.12-0.015 MG/24HR vaginal ring Place 1 each vaginally every 28 (twenty-eight) days. Insert vaginally and leave in place for 3 consecutive weeks, then remove for 1 week.    [provider]    Allergies Benadryl [diphenhydramine hcl] and Metronidazole and related  No family history on file.  Social History Social History  Substance Use Topics  . Smoking status: Never Smoker  . Smokeless tobacco: Not on file  . Alcohol use No    Review of Systems Constitutional: No fever/chills ENT: No sore throat. Cardiovascular: Denies chest pain. Respiratory: Denies shortness of breath. Gastrointestinal: No abdominal pain.   Genitourinary: Negative for dysuria.Positive for pregnancy. Musculoskeletal: Negative for back pain. Skin: Positive for facial rash. Neurological: Negative for headaches, focal weakness  or numbness. ____________________________________________   PHYSICAL EXAM:  VITAL SIGNS: ED Triage Vitals  Enc Vitals Group     BP 07/27/17 1114 111/62     Pulse Rate 07/27/17 1114 74     Resp 07/27/17 1114 14     Temp 07/27/17 1114 98.4 F (36.9 C)     Temp Source 07/27/17 1114 Oral     SpO2 07/27/17 1114 98 %     Weight 07/27/17 1111 165 lb (74.8 kg)     Height 07/27/17 1111  (1.6 m)     Head Circumference --      Peak Flow --      Pain Score --      Pain Loc --      Pain Edu? --      Excl. in GC? --    Constitutional: Alert and oriented. Well appearing and in no acute distress. Eyes: Conjunctivae are normal.  Head: Atraumatic. Nose: No congestion/rhinnorhea. Mouth/Throat: Mucous membranes are moist.  Oropharynx non-erythematous. Neck: No stridor.  Hematological/Lymphatic/Immunilogical: No cervical lymphadenopathy. Cardiovascular: Normal rate, regular rhythm. Grossly normal heart sounds.  Good peripheral circulation. Respiratory: Normal respiratory effort.  No retractions. Lungs CTAB. Musculoskeletal: Moves upper and lower extremities without difficulty. Normal gait was noted. Neurologic:  Normal speech and language. No gross focal neurologic deficits are appreciated. Skin:  Skin is warm, dry.  Erythematous papules noted on the face bilaterally. Nontender to palpation. No drainage is noted. Most papules appear to have a hair follicle in central area. Psychiatric: Mood and affect are normal. Speech and behavior are normal.  ____________________________________________   LABS (all labs ordered are listed, but only abnormal results are displayed)  Labs  Reviewed - No data to display ____________________________________________  PROCEDURES  Procedure(s) performed: None  Procedures  Critical Care performed: No  ____________________________________________   INITIAL IMPRESSION / ASSESSMENT AND PLAN / ED COURSE  Pertinent labs & imaging results that were  available during my care of the patient were reviewed by me and considered in my medical decision making (see chart for details).  Patient was placed on Keflex 500 mg and given list of dermatologists in the area. She is to call and make an appointment if there is not any improvement of her facial rash. Patient again denies any itching or swelling to the area she denies any fever or chills.    ___________________________________________   FINAL CLINICAL IMPRESSION(S) / ED DIAGNOSES  Final diagnoses:  Folliculitis      NEW MEDICATIONS STARTED DURING THIS VISIT:  Discharge Medication List as of 07/27/2017 12:10 PM    START taking these medications   Details  cephALEXin (KEFLEX) 500 MG capsule Take 1 capsule (500 mg total) by mouth 3 (three) times daily., Starting Thu 07/27/2017, Print         Note:  This document was prepared using Dragon voice recognition software and may include unintentional dictation errors.    Tommi RumpsSummers, Zena Vitelli L, PA-C 07/27/17 1517    Willy Eddyobinson, Patrick, MD 07/27/17 1539

## 2019-07-13 ENCOUNTER — Other Ambulatory Visit: Payer: Self-pay

## 2019-07-13 DIAGNOSIS — Z3A00 Weeks of gestation of pregnancy not specified: Secondary | ICD-10-CM | POA: Diagnosis not present

## 2019-07-13 DIAGNOSIS — Z79899 Other long term (current) drug therapy: Secondary | ICD-10-CM | POA: Diagnosis not present

## 2019-07-13 DIAGNOSIS — O200 Threatened abortion: Secondary | ICD-10-CM | POA: Insufficient documentation

## 2019-07-13 LAB — CBC WITH DIFFERENTIAL/PLATELET
Abs Immature Granulocytes: 0.03 10*3/uL (ref 0.00–0.07)
Basophils Absolute: 0.1 10*3/uL (ref 0.0–0.1)
Basophils Relative: 1 %
Eosinophils Absolute: 0.4 10*3/uL (ref 0.0–0.5)
Eosinophils Relative: 3 %
HCT: 39.2 % (ref 36.0–46.0)
Hemoglobin: 13.1 g/dL (ref 12.0–15.0)
Immature Granulocytes: 0 %
Lymphocytes Relative: 35 %
Lymphs Abs: 3.6 10*3/uL (ref 0.7–4.0)
MCH: 29.6 pg (ref 26.0–34.0)
MCHC: 33.4 g/dL (ref 30.0–36.0)
MCV: 88.5 fL (ref 80.0–100.0)
Monocytes Absolute: 0.7 10*3/uL (ref 0.1–1.0)
Monocytes Relative: 6 %
Neutro Abs: 5.7 10*3/uL (ref 1.7–7.7)
Neutrophils Relative %: 55 %
Platelets: 312 10*3/uL (ref 150–400)
RBC: 4.43 MIL/uL (ref 3.87–5.11)
RDW: 12.5 % (ref 11.5–15.5)
WBC: 10.4 10*3/uL (ref 4.0–10.5)
nRBC: 0 % (ref 0.0–0.2)

## 2019-07-13 LAB — ABO/RH: ABO/RH(D): A POS

## 2019-07-13 LAB — HCG, QUANTITATIVE, PREGNANCY: hCG, Beta Chain, Quant, S: 66 m[IU]/mL — ABNORMAL HIGH (ref <5)

## 2019-07-13 NOTE — ED Triage Notes (Signed)
Patient reports being approximately [redacted] weeks pregnant and started with vaginal bleeding approximately 2 hours ago.

## 2019-07-14 ENCOUNTER — Emergency Department: Payer: BC Managed Care – PPO

## 2019-07-14 ENCOUNTER — Emergency Department
Admission: EM | Admit: 2019-07-14 | Discharge: 2019-07-14 | Disposition: A | Discharge status: Home / Self Care | Payer: BC Managed Care – PPO | Attending: Emergency Medicine | Admitting: Emergency Medicine

## 2019-07-14 DIAGNOSIS — O200 Threatened abortion: Secondary | ICD-10-CM

## 2019-07-14 DIAGNOSIS — O209 Hemorrhage in early pregnancy, unspecified: Secondary | ICD-10-CM

## 2019-07-14 NOTE — ED Provider Notes (Signed)
[facility] Emergency Department Provider Note  ____________________________________________  Time seen: Approximately 1:22 AM  I have reviewed the triage vital signs and the nursing notes.   HISTORY  Chief Complaint Vaginal Bleeding   HPI Nichole Little is a 23 y.o. female G2P1 at [redacted] weeks GA per LMP who presents for evaluation of vaginal bleeding.  Patient reports having a positive pregnancy test at home a couple weeks ago.  Today started having vaginal bleeding and passing clots.  Had some mild lower abdominal cramping however that has resolved.  No dizziness, no history of anemia, no chest pain or shortness of breath.  Patient had no complications in her first pregnancy and has an 5773-month-old child at home.   PMH None - reviewed  Prior to Admission medications   Medication Sig Start Date End Date Taking? Authorizing Provider  baclofen (LIORESAL) 10 MG tablet Take 0.5 tablets (5 mg total) by mouth 3 (three) times daily. 04/25/16   Beers, Charmayne Sheerharles M, PA-C  cephALEXin (KEFLEX) 500 MG capsule Take 1 capsule (500 mg total) by mouth 3 (three) times daily. 07/27/17   Tommi RumpsSummers, Rhonda L, PA-C  etonogestrel-ethinyl estradiol (NUVARING) 0.12-0.015 MG/24HR vaginal ring Place 1 each vaginally every 28 (twenty-eight) days. Insert vaginally and leave in place for 3 consecutive weeks, then remove for 1 week.    [provider]    Allergies Penicillins, Benadryl [diphenhydramine hcl], and Metronidazole and related  No family history on file.  Social History Social History   Tobacco Use   Smoking status: Never Smoker  Substance Use Topics   Alcohol use: No   Drug use: Not on file    Review of Systems  Constitutional: Negative for fever. Eyes: Negative for visual changes. ENT: Negative for sore throat. Neck: No neck pain  Cardiovascular: Negative for chest pain. Respiratory: Negative for shortness of breath. Gastrointestinal: Negative for  abdominal pain, vomiting or diarrhea. Genitourinary: Negative for dysuria. + vaginal bleeding Musculoskeletal: Negative for back pain. Skin: Negative for rash. Neurological: Negative for headaches, weakness or numbness. Psych: No SI or HI  ____________________________________________   PHYSICAL EXAM:  VITAL SIGNS: ED Triage Vitals  Enc Vitals Group     BP 07/13/19 2051 136/74     Pulse Rate 07/13/19 2051 75     Resp 07/13/19 2051 18     Temp 07/13/19 2051 98.2 F (36.8 C)     Temp Source 07/13/19 2051 Oral     SpO2 07/13/19 2051 100 %     Weight 07/13/19 2050 180 lb (81.6 kg)     Height 07/13/19 2050 5\' 3"  (1.6 m)     Head Circumference --      Peak Flow --      Pain Score 07/13/19 2050 4     Pain Loc --      Pain Edu? --      Excl. in GC? --     Constitutional: Alert and oriented. Well appearing and in no apparent distress. HEENT:      Head: Normocephalic and atraumatic.         Eyes: Conjunctivae are normal. Sclera is non-icteric.       Mouth/Throat: Mucous membranes are moist.       Neck: Supple with no signs of meningismus. Cardiovascular: Regular rate and rhythm. No murmurs, gallops, or rubs. 2+ symmetrical distal pulses are present in all extremities. No JVD. Respiratory: Normal respiratory effort. Lungs are clear to auscultation bilaterally. No wheezes, crackles, or rhonchi.  Gastrointestinal:  Soft, non tender, and non distended with positive bowel sounds. No rebound or guarding. Pelvic exam: Normal external genitalia, no rashes or lesions. Normal cervical mucus. Os closed with small amount of blood in the vaginal vault. No cervical motion tenderness.  No uterine or adnexal tenderness.   Musculoskeletal: Nontender with normal range of motion in all extremities. No edema, cyanosis, or erythema of extremities. Neurologic: Normal speech and language. Face is symmetric. Moving all extremities. No gross focal neurologic deficits are appreciated. Skin: Skin is warm, dry  and intact. No rash noted. Psychiatric: Mood and affect are normal. Speech and behavior are normal.  ____________________________________________   LABS (all labs ordered are listed, but only abnormal results are displayed)  Labs Reviewed  HCG, QUANTITATIVE, PREGNANCY - Abnormal; Notable for the following components:      Result Value   hCG, Beta Chain, Quant, S 66 (*)    All other components within normal limits  CBC WITH DIFFERENTIAL/PLATELET  POC URINE PREG, ED  ABO/RH   ____________________________________________  EKG  none  ____________________________________________  RADIOLOGY  I have personally reviewed the images performed during this visit and I agree with the Radiologist's read.   Interpretation by Radiologist:  US Ob Less Than 14 Weeks With Ob Transvaginal  Result Date: 07/14/2019 CLINICAL DATA:  Vaginal bleeding EXAM: OBSTETRIC <14 WK [place] AND TRANSVAGINAL OB US TECHNIQUE: Both transabdominal and transvaginal ultrasound examinations were performed for complete evaluation of the gestation as well as the maternal uterus, adnexal regions, and pelvic cul-de-sac. Transvaginal technique was performed to assess early pregnancy. COMPARISON:  None. FINDINGS: Intrauterine gestational sac: None Yolk sac:  Not Visualized. Embryo:  Not Visualized. Maternal uterus/adnexae: Ovaries are within normal limits. Right ovary measures 2.1 by 2.6 x 1.2 cm with a volume of 3.5 mL. The left ovary measures 2.1 by 3.3 x 2 cm with a volume of 7 mL. No free fluid IMPRESSION: No IUP identified. Findings consistent with pregnancy of unknown location, differential of which includes IUP too early to visualize, recent failed pregnancy, and occult ectopic. Recommend trending of HCG with repeat ultrasound as indicated Electronically Signed   By: Donavan Foil M.D.   On: 07/14/2019 00:54     ____________________________________________   PROCEDURES  Procedure(s) performed: None Procedures Critical  Care performed:  None ____________________________________________   INITIAL IMPRESSION / ASSESSMENT AND PLAN / ED COURSE  23 y.o. female G2P1 at [redacted] weeks GA per LMP who presents for evaluation of vaginal bleeding.  Patient is hemodynamically stable, normal hemoglobin of 13.1.  hCG hormone of 66.  Transvaginal ultrasound showing no IUP identified.  Abdomen is otherwise soft.  Pelvic exam showing closed os with small amount of blood in the vaginal vault.  No gestational sac seen.  Differential diagnosis including miscarriage which is most likely considering the level of hCG hormone and the fact the patient had a positive home pregnancy test a few weeks ago. Possibly also too early to be able to be visualized versus an occult ectopic pregnancy.  Patient has an appointment with her OB/GYN in 4 days.  Recommending keeping that appointment for repeat beta quant.  Blood type with Rh+ with no indication for RhoGam.  Discussed supportive care and follow-up.  Discussed my standard return precautions.        As part of my medical decision making, I reviewed the following data within the [facility] notes reviewed and incorporated, Labs reviewed , Old chart reviewed, Radiograph reviewed , Notes from prior ED  visits and [place] Controlled Substance Database   Patient was evaluated in Emergency Department today for the symptoms described in the history of present illness. Patient was evaluated in the context of the global COVID-19 pandemic, which necessitated consideration that the patient might be at risk for infection with the SARS-CoV-2 virus that causes COVID-19. Institutional protocols and algorithms that pertain to the evaluation of patients at risk for COVID-19 are in a state of rapid change based on information released by regulatory bodies including the CDC and federal and state organizations. These policies and algorithms were followed during the patient's care in the  ED.   ____________________________________________   FINAL CLINICAL IMPRESSION(S) / ED DIAGNOSES   Final diagnoses:  Threatened miscarriage      NEW MEDICATIONS STARTED DURING THIS VISIT:  ED Discharge Orders    None       Note:  This document was prepared using Dragon voice recognition software and may include unintentional dictation errors.    Don Perking, [place], MD 07/14/19 [phone]

## 2019-10-31 DIAGNOSIS — Z3482 Encounter for supervision of other normal pregnancy, second trimester: Secondary | ICD-10-CM | POA: Insufficient documentation

## 2019-11-05 LAB — OB RESULTS CONSOLE RPR: RPR: NONREACTIVE

## 2019-11-05 LAB — OB RESULTS CONSOLE VARICELLA ZOSTER ANTIBODY, IGG: Varicella: IMMUNE

## 2019-11-05 LAB — OB RESULTS CONSOLE RUBELLA ANTIBODY, IGM: Rubella: IMMUNE

## 2019-11-05 LAB — OB RESULTS CONSOLE HEPATITIS B SURFACE ANTIGEN: Hepatitis B Surface Ag: NEGATIVE

## 2019-11-15 NOTE — L&D Delivery Note (Signed)
Delivery Note  Date of delivery: 05/16/2020 Estimated Date of Delivery: 05/16/20 Patient's last menstrual period was 08/10/2019 (exact date). EGA: [redacted]w[redacted]d  First Stage: Labor onset: 0500 05/16/2020 Augmentation : AROM Analgesia Eliezer Lofts intrapartum: IV pain meds AROM at 0957  Danella Sensing presented to L&D with contractions. She was augmented with AROM. IV pain medication given for pain relief.   Second Stage: Complete dilation at 1045 Onset of pushing at 1045 FHR second stage: category II Delivery at 1059 on 05/16/2020  She progressed to complete and had a spontaneous vaginal birth of a live female, pushing over an intact perineum. The fetal head was delivered in direct OA position with restitution to ROA. No nuchal cord. Anterior then posterior shoulders delivered with minimal assistance. Baby placed on mom's abdomen and attended to by transition RN. Cord clamped and cut when pulseless by father of the baby.   Third Stage: Placenta delivered spontaneously intact with 3VC at 1108 Placenta disposition: routine disposal Uterine tone firm / bleeding initially brisk then slowed IM pitocin given for hemorrhage prophylaxis due to loss of IV access  1st degree perineal and b/l labial lacerations identified  Anesthesia for repair: lidocaine Repair: 3-0 Vicryl  Est. Blood Loss (mL): 672  Complications: none  Mom to postpartum.  Baby to Couplet care / Skin to Skin.  Newborn: Birth Weight: pending  Apgar Scores: 8, 9 Feeding planned: breast   Genia Del, CNM 05/16/2020 12:22 PM

## 2020-01-23 IMAGING — US OBSTETRIC <14 WK US AND TRANSVAGINAL OB US
1 series · 14 of 28 positions shown · non-contrast
Comparison: None.

CLINICAL DATA: Vaginal bleeding

EXAM:
OBSTETRIC <14 WK US AND TRANSVAGINAL OB US
TECHNIQUE: Both transabdominal and transvaginal ultrasound examinations were
performed for complete evaluation of the gestation as well as the
maternal uterus, adnexal regions, and pelvic cul-de-sac.
Transvaginal technique was performed to assess early pregnancy.

[Series 1: obstetric <14 wk us and transvaginal ob us · 14 of 106 slices shown]
[im 4/106]
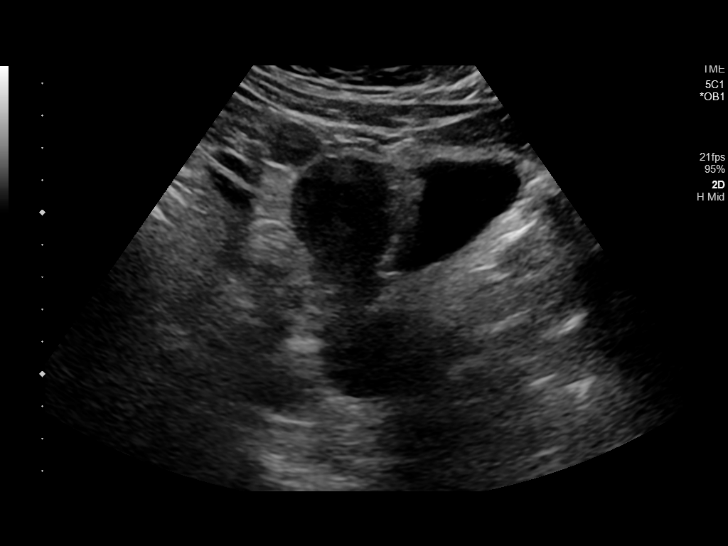
[im 12/106]
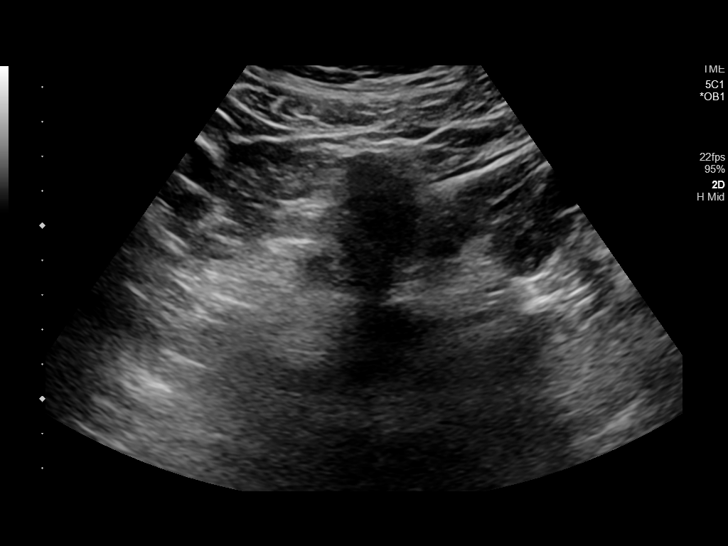
[im 20/106]
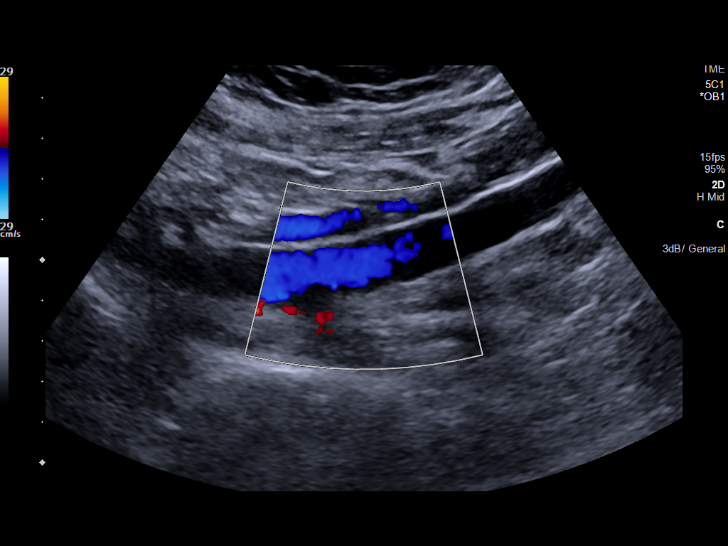
[im 28/106]
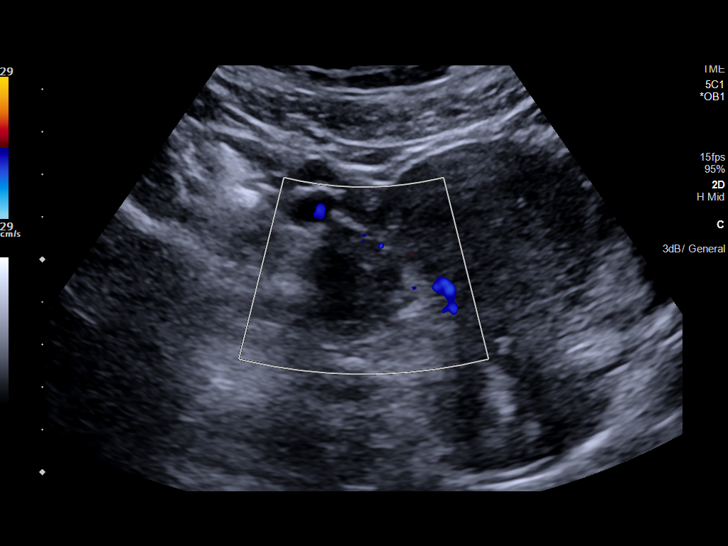
[im 36/106]
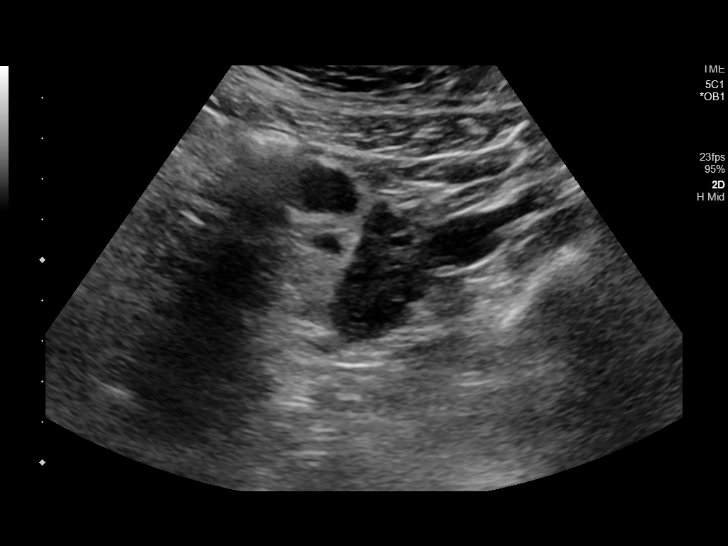
[im 43/106]
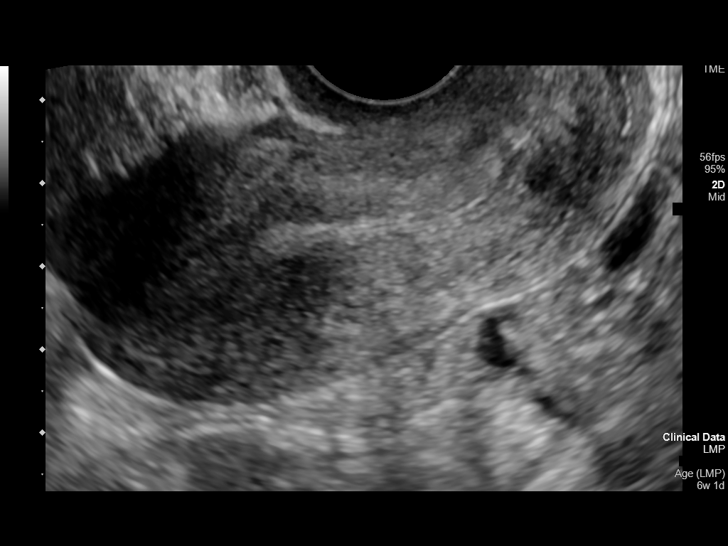
[im 51/106]
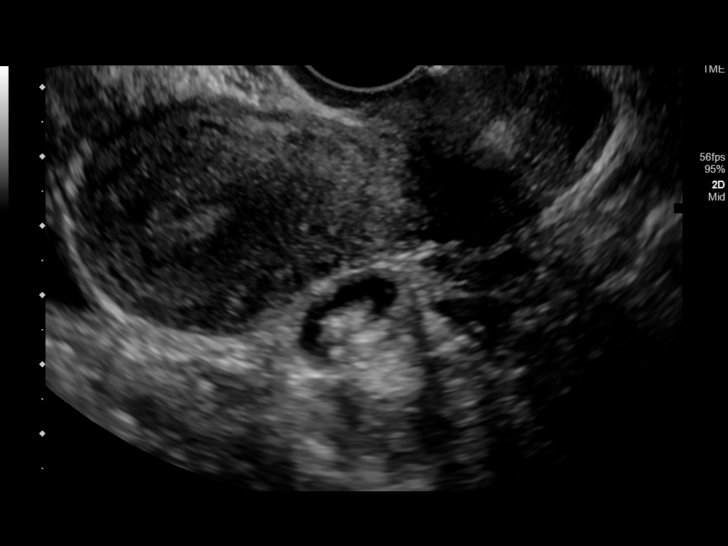
[im 59/106]
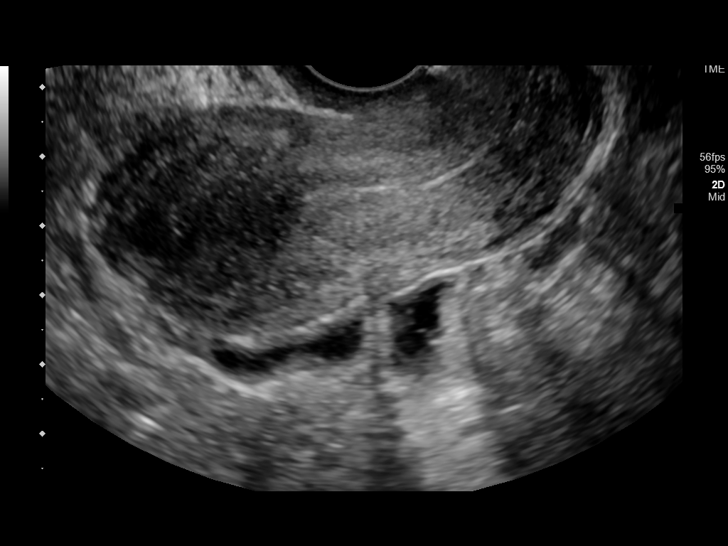
[im 67/106]
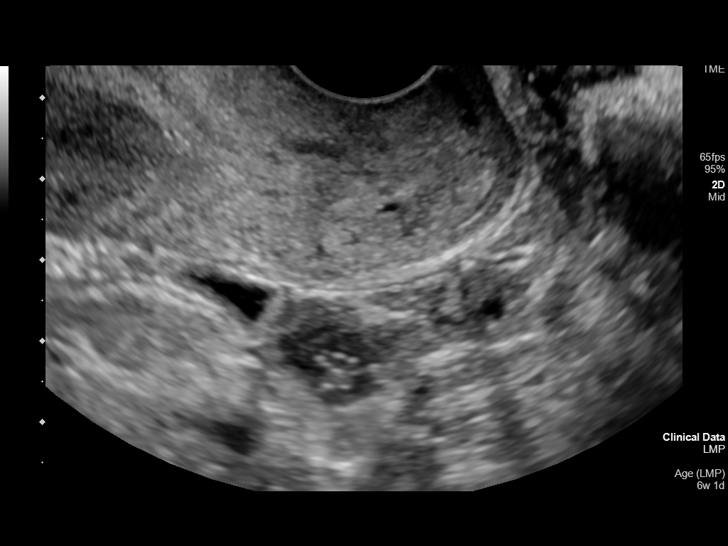
[im 74/106]
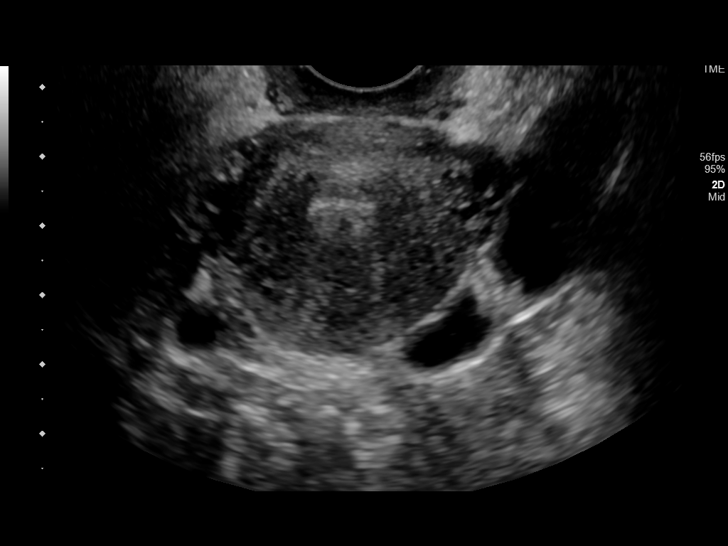
[im 82/106]
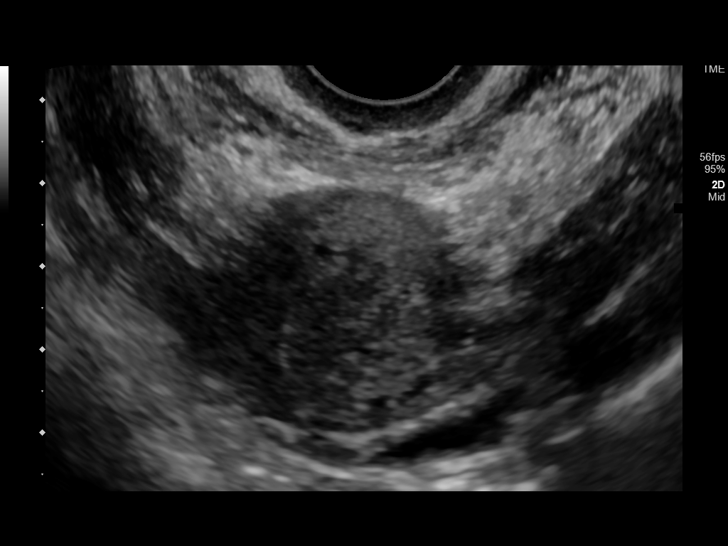
[im 90/106]
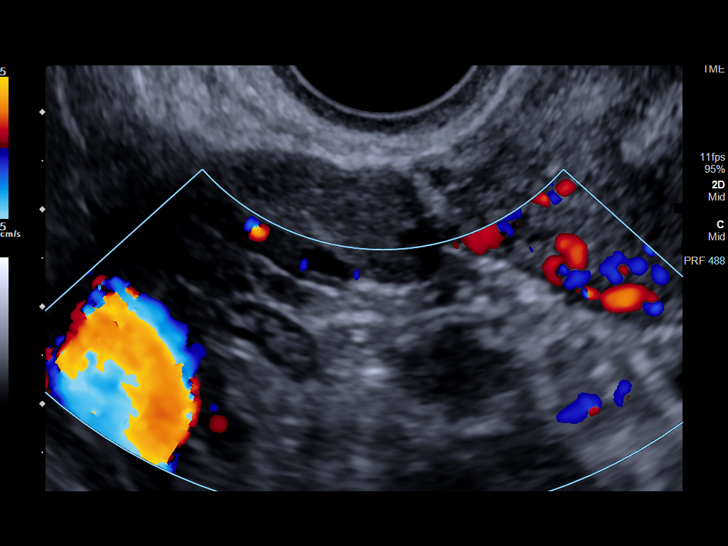
[im 98/106]
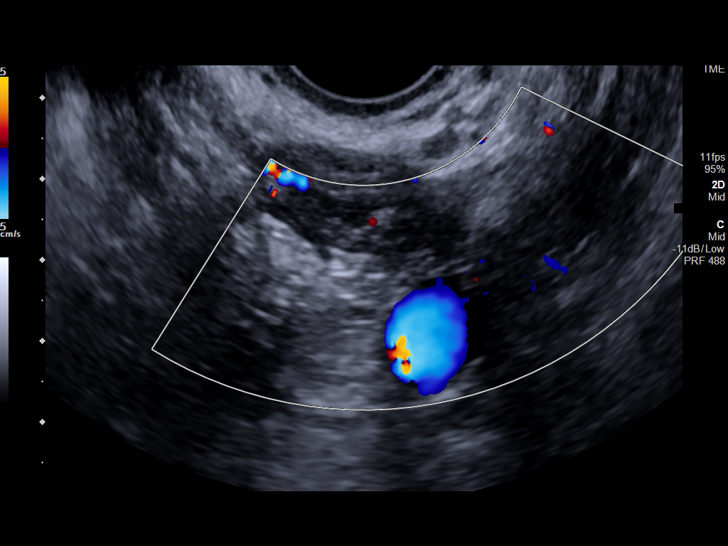
[im 106/106]
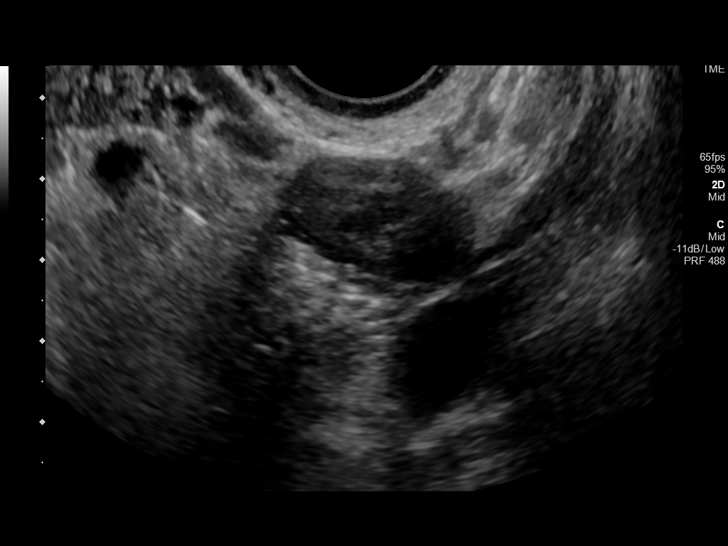

[14 of 28 positions shown; findings below may reference images not displayed]

FINDINGS: Intrauterine gestational sac: None

Yolk sac:  Not Visualized.

Embryo:  Not Visualized.

Maternal uterus/adnexae: Ovaries are within normal limits. Right
ovary measures 2.1 by 2.6 x 1.2 cm with a volume of 3.5 mL. The left
ovary measures 2.1 by 3.3 x 2 cm with a volume of 7 mL. No free
fluid
IMPRESSION: No IUP identified. Findings consistent with pregnancy of unknown
location, differential of which includes IUP too early to visualize,
recent failed pregnancy, and occult ectopic. Recommend trending of
HCG with repeat ultrasound as indicated

## 2020-01-27 ENCOUNTER — Other Ambulatory Visit: Payer: Self-pay

## 2020-01-27 ENCOUNTER — Observation Stay
Admission: EM | Admit: 2020-01-27 | Discharge: 2020-01-27 | Disposition: A | Discharge status: Home / Self Care | Payer: BC Managed Care – PPO | Attending: Certified Nurse Midwife | Admitting: Certified Nurse Midwife

## 2020-01-27 ENCOUNTER — Encounter: Payer: Self-pay | Admitting: Emergency Medicine

## 2020-01-27 DIAGNOSIS — O26892 Other specified pregnancy related conditions, second trimester: Principal | ICD-10-CM | POA: Insufficient documentation

## 2020-01-27 DIAGNOSIS — O219 Vomiting of pregnancy, unspecified: Secondary | ICD-10-CM | POA: Diagnosis present

## 2020-01-27 DIAGNOSIS — Z881 Allergy status to other antibiotic agents status: Secondary | ICD-10-CM | POA: Diagnosis not present

## 2020-01-27 DIAGNOSIS — O99891 Other specified diseases and conditions complicating pregnancy: Secondary | ICD-10-CM | POA: Diagnosis not present

## 2020-01-27 DIAGNOSIS — Z888 Allergy status to other drugs, medicaments and biological substances status: Secondary | ICD-10-CM | POA: Insufficient documentation

## 2020-01-27 DIAGNOSIS — R197 Diarrhea, unspecified: Secondary | ICD-10-CM | POA: Insufficient documentation

## 2020-01-27 DIAGNOSIS — Z880 Allergy status to penicillin: Secondary | ICD-10-CM | POA: Insufficient documentation

## 2020-01-27 DIAGNOSIS — Z3A24 24 weeks gestation of pregnancy: Secondary | ICD-10-CM | POA: Insufficient documentation

## 2020-01-27 DIAGNOSIS — E669 Obesity, unspecified: Secondary | ICD-10-CM | POA: Insufficient documentation

## 2020-01-27 DIAGNOSIS — O212 Late vomiting of pregnancy: Secondary | ICD-10-CM | POA: Diagnosis not present

## 2020-01-27 DIAGNOSIS — M549 Dorsalgia, unspecified: Secondary | ICD-10-CM | POA: Insufficient documentation

## 2020-01-27 HISTORY — DX: Other specified health status: Z78.9

## 2020-01-27 LAB — URINALYSIS, COMPLETE (UACMP) WITH MICROSCOPIC
Bacteria, UA: NONE SEEN
Bilirubin Urine: NEGATIVE
Glucose, UA: NEGATIVE mg/dL
Hgb urine dipstick: NEGATIVE
Ketones, ur: 80 mg/dL — AB
Leukocytes,Ua: NEGATIVE
Nitrite: NEGATIVE
Protein, ur: 30 mg/dL — AB
Specific Gravity, Urine: 1.026 (ref 1.005–1.030)
pH: 5 (ref 5.0–8.0)

## 2020-01-27 LAB — COMPREHENSIVE METABOLIC PANEL
ALT: 12 U/L (ref 0–44)
AST: 17 U/L (ref 15–41)
Albumin: 3.2 g/dL — ABNORMAL LOW (ref 3.5–5.0)
Alkaline Phosphatase: 97 U/L (ref 38–126)
Anion gap: 11 (ref 5–15)
BUN: 14 mg/dL (ref 6–20)
CO2: 20 mmol/L — ABNORMAL LOW (ref 22–32)
Calcium: 8.5 mg/dL — ABNORMAL LOW (ref 8.9–10.3)
Chloride: 105 mmol/L (ref 98–111)
Creatinine, Ser: 0.64 mg/dL (ref 0.44–1.00)
GFR calc Af Amer: >60 mL/min (ref >60)
GFR calc non Af Amer: >60 mL/min (ref >60)
Glucose, Bld: 124 mg/dL — ABNORMAL HIGH (ref 70–99)
Potassium: 3.9 mmol/L (ref 3.5–5.1)
Sodium: 136 mmol/L (ref 135–145)
Total Bilirubin: 0.5 mg/dL (ref 0.3–1.2)
Total Protein: 6.8 g/dL (ref 6.5–8.1)

## 2020-01-27 LAB — CBC
HCT: 39 % (ref 36.0–46.0)
Hemoglobin: 12.9 g/dL (ref 12.0–15.0)
MCH: 29.9 pg (ref 26.0–34.0)
MCHC: 33.1 g/dL (ref 30.0–36.0)
MCV: 90.3 fL (ref 80.0–100.0)
Platelets: 224 10*3/uL (ref 150–400)
RBC: 4.32 MIL/uL (ref 3.87–5.11)
RDW: 13.4 % (ref 11.5–15.5)
WBC: 15.3 10*3/uL — ABNORMAL HIGH (ref 4.0–10.5)
nRBC: 0 % (ref 0.0–0.2)

## 2020-01-27 LAB — LIPASE, BLOOD: Lipase: 25 U/L (ref 11–51)

## 2020-01-27 LAB — AMYLASE: Amylase: 70 U/L (ref 28–100)

## 2020-01-27 MED ORDER — ONDANSETRON 4 MG PO TBDP: 4.0000 mg | ORAL_TABLET | Freq: Three times a day (TID) | ORAL | 0 refills | Status: DC | PRN

## 2020-01-27 MED ORDER — ACETAMINOPHEN 500 MG PO TABS
1000.0000 mg | ORAL_TABLET | Freq: Four times a day (QID) | ORAL | Status: DC | PRN
  Filled 2020-01-27: qty 2

## 2020-01-27 MED ORDER — LACTATED RINGERS IV BOLUS
1000.0000 mL | Freq: Once | INTRAVENOUS | Status: AC
  Administered 2020-01-27: 1000 mL via INTRAVENOUS

## 2020-01-27 MED ORDER — PROMETHAZINE HCL 25 MG/ML IJ SOLN
25.0000 mg | INTRAMUSCULAR | Status: DC
  Filled 2020-01-27: qty 1

## 2020-01-27 MED ORDER — SIMETHICONE 80 MG PO CHEW
160.0000 mg | CHEWABLE_TABLET | Freq: Four times a day (QID) | ORAL | Status: DC | PRN
  Administered 2020-01-27: 160 mg via ORAL
  Filled 2020-01-27: qty 2

## 2020-01-27 MED ORDER — PROMETHAZINE HCL 25 MG PO TABS: 25.0000 mg | ORAL_TABLET | ORAL | Status: DC

## 2020-01-27 MED ORDER — LOPERAMIDE HCL 2 MG PO CAPS: 2.0000 mg | ORAL_CAPSULE | ORAL | 0 refills | Status: DC | PRN

## 2020-01-27 MED ORDER — ONDANSETRON 4 MG PO TBDP: 4.0000 mg | ORAL_TABLET | Freq: Three times a day (TID) | ORAL | Status: DC | PRN

## 2020-01-27 MED ORDER — PROMETHAZINE HCL 25 MG/ML IJ SOLN
25.0000 mg | INTRAMUSCULAR | Status: DC | PRN
  Administered 2020-01-27: 25 mg via INTRAVENOUS
  Filled 2020-01-27: qty 1

## 2020-01-27 MED ORDER — ONDANSETRON HCL 4 MG/2ML IJ SOLN
4.0000 mg | Freq: Three times a day (TID) | INTRAMUSCULAR | Status: DC | PRN
  Administered 2020-01-27: 11:00:00 4 mg via INTRAVENOUS
  Filled 2020-01-27: qty 2

## 2020-01-27 MED ORDER — PROMETHAZINE HCL 25 MG PO TABS: 25.0000 mg | ORAL_TABLET | ORAL | Status: DC | PRN

## 2020-01-27 MED ORDER — LACTATED RINGERS IV SOLN
INTRAVENOUS | Status: DC
  Administered 2020-01-27: 12:00:00 1000 mL via INTRAVENOUS

## 2020-01-27 NOTE — Progress Notes (Signed)
Nichole Little is a 24 y.o. female. She is at [redacted]w[redacted]d gestation. Patient's last menstrual period was 06/01/2019. Estimated Date of Delivery: 05/17/20  Prenatal care site: [facility]  Chief complaint: nausea/vomiting Location: stomach/mouth Onset/timing: today at 0500 Duration: intermittent Quality: vomiting Severity: moderate to severe Aggravating or alleviating conditions: none Associated signs/symptoms: diarrhea, lower abdominal and back pain Context: Nichole Little reports nausea with vomiting when she woke up this morning, along with liquid diarrhea. She reports that she has vomited 7-8 times and had about 20 episodes of diarrhea. She has had some lower abdominal and back pain throughout all of this.   S: Resting comfortably.   She reports:  -active fetal movement -no leakage of fluid -no vaginal bleeding -no contractions  Maternal Medical History:   Past Medical History:  Diagnosis Date  . Medical history non-contributory     Past Surgical History:  Procedure Laterality Date  . NO PAST SURGERIES      Allergies  Allergen Reactions  . Penicillins Hives  . Benadryl [Diphenhydramine Hcl] Rash  . Metronidazole And Related Hives and Rash    Prior to Admission medications   Medication Sig Start Date End Date Taking? Authorizing Provider  Prenatal Vit-Fe Fumarate-FA (MULTIVITAMIN-PRENATAL) 27-0.8 MG TABS tablet Take 1 tablet by mouth daily at 12 noon.   Yes [provider]  baclofen (LIORESAL) 10 MG tablet Take 0.5 tablets (5 mg total) by mouth 3 (three) times daily. Patient not taking: Reported on 01/27/2020 04/25/16   Beers, Charmayne Sheer, PA-C  cephALEXin (KEFLEX) 500 MG capsule Take 1 capsule (500 mg total) by mouth 3 (three) times daily. Patient not taking: Reported on 01/27/2020 07/27/17   Tommi Rumps, PA-C  etonogestrel-ethinyl estradiol (NUVARING) 0.12-0.015 MG/24HR vaginal ring Place 1 each vaginally every 28 (twenty-eight) days. Insert vaginally and  leave in place for 3 consecutive weeks, then remove for 1 week.    [provider]     Social History: She  reports that she has never smoked. She has never used smokeless tobacco. She reports that she does not drink alcohol or use drugs.  Family History: family history includes Asthma in her mother; COPD in her maternal grandmother; Cervical cancer in her mother; Deep vein thrombosis in her maternal grandmother; Diabetes Mellitus II in her maternal grandmother and mother; Heart attack in her maternal grandmother; Hyperlipidemia in her maternal grandmother; Hypertension in her mother; Kidney disease in her maternal grandmother; Osteoarthritis in her maternal grandmother; Osteoporosis in her maternal grandmother; Skin cancer in her mother; Transient ischemic attack in her maternal grandmother.    Review of Systems: A full review of systems was performed and negative except as noted in the HPI.     O:  BP (!) 105/53   Pulse 99   Temp 98.4 F (36.9 C)   Resp 18   Ht 5\' 3"  (1.6 m)   Wt 84.4 kg   LMP 06/01/2019   BMI 32.95 kg/m  Results for orders placed or performed during the hospital encounter of 01/27/20 (from the past 48 hour(s))  Lipase, blood   Collection Time: 01/27/20 10:55 AM  Result Value Ref Range   Lipase 25 11 - 51 U/L  Comprehensive metabolic panel   Collection Time: 01/27/20 10:55 AM  Result Value Ref Range   Sodium 136 135 - 145 mmol/L   Potassium 3.9 3.5 - 5.1 mmol/L   Chloride 105 98 - 111 mmol/L   CO2 20 (L) 22 - 32 mmol/L   Glucose, Bld  124 (H) 70 - 99 mg/dL   BUN 14 6 - 20 mg/dL   Creatinine, Ser 0.64 0.44 - 1.00 mg/dL   Calcium 8.5 (L) 8.9 - 10.3 mg/dL   Total Protein 6.8 6.5 - 8.1 g/dL   Albumin 3.2 (L) 3.5 - 5.0 g/dL   AST 17 15 - 41 U/L   ALT 12 0 - 44 U/L   Alkaline Phosphatase 97 38 - 126 U/L   Total Bilirubin 0.5 0.3 - 1.2 mg/dL   GFR calc non Af Amer >60 >60 mL/min   GFR calc Af Amer >60 >60 mL/min   Anion gap 11 5 - 15  CBC    Collection Time: 01/27/20 10:55 AM  Result Value Ref Range   WBC 15.3 (H) 4.0 - 10.5 K/uL   RBC 4.32 3.87 - 5.11 MIL/uL   Hemoglobin 12.9 12.0 - 15.0 g/dL   HCT 39.0 36.0 - 46.0 %   MCV 90.3 80.0 - 100.0 fL   MCH 29.9 26.0 - 34.0 pg   MCHC 33.1 30.0 - 36.0 g/dL   RDW 13.4 11.5 - 15.5 %   Platelets 224 150 - 400 K/uL   nRBC 0.0 0.0 - 0.2 %  Amylase   Collection Time: 01/27/20 10:55 AM  Result Value Ref Range   Amylase 70 28 - 100 U/L     Constitutional: NAD, AAOx3  HE/ENT: extraocular movements grossly intact, moist mucous membranes CV: RRR PULM: normal respiratory effort, CTABL     Abd: gravid, non-tender, non-distended, soft      Ext: Non-tender, Nonedmeatous   Psych: mood appropriate, speech normal Pelvic deferred  Fetal monitoring:  Baseline: 150bpm Variability: moderate  Toco: quiet   A/P: 24 y.o. [redacted]w[redacted]d here for antenatal surveillance during pregnancy.  Principle diagnosis: Nausea, vomiting, diarrhea during pregnancy  Labor  Not present  Fetal Wellbeing  Reassuring for GA  Check FHTs q4h  Nausea, vomiting, diarrhea  No vomiting or diarrhea for the past hour  S/p 1L LR bolus, now running at 141mL/hr  Advance diet as tolerated, starting with clears  Zofran IV or ODT PRN, Phenergan IV or PO PRN  Simethicone PRN for gas discomfort   Nichole Little 01/27/2020 12:11 PM  ----- Nichole Little, CNM Certified Nurse Midwife [facility], Department of OB/GYN [facility]

## 2020-01-27 NOTE — Discharge Instructions (Signed)
You likely have a virus or a food-borne illness that will likely be self-resolving.   Take anti-nausea medications and anti-diarrhea medications as needed.    Keep your next scheduled appointment.

## 2020-01-27 NOTE — ED Triage Notes (Signed)
[redacted] week pregnant.  Says stomach pains and vomiting all night and diarrhea.

## 2020-01-27 NOTE — OB Triage Note (Signed)
Discharge instructions, prescriptions, and follow-up care reviewed. Pt verbalized understanding. Pt discharged via wheelchair with significant other.

## 2020-01-27 NOTE — Discharge Summary (Addendum)
Nichole Little is a 24 y.o. female. She is at [redacted]w[redacted]d gestation. Patient's last menstrual period was 06/01/2019. Estimated Date of Delivery: 05/16/20  Prenatal care site: [facility]   Chief complaint: GI illness  Context: N/V/D started around 0500 this AM, intermittent abdominal pain.  She received zofran and phenergan and both N/V subsided, and diarrhea improved.  No fever, chills, lightheaded, dizzy.  No blood in emesis or stool.  S:no CTX, no VB.no LOF,  Active fetal movement.   Maternal Medical History:   Past Medical History:  Diagnosis Date  . Medical history non-contributory     Past Surgical History:  Procedure Laterality Date  . NO PAST SURGERIES      Allergies  Allergen Reactions  . Penicillins Hives  . Benadryl [Diphenhydramine Hcl] Rash  . Metronidazole And Related Hives and Rash    Prior to Admission medications   Medication Sig Start Date End Date Taking? Authorizing Provider  Prenatal Vit-Fe Fumarate-FA (MULTIVITAMIN-PRENATAL) 27-0.8 MG TABS tablet Take 1 tablet by mouth daily at 12 noon.   Yes [provider]  baclofen (LIORESAL) 10 MG tablet Take 0.5 tablets (5 mg total) by mouth 3 (three) times daily. Patient not taking: Reported on 01/27/2020 04/25/16   Beers, Charmayne Sheer, PA-C  cephALEXin (KEFLEX) 500 MG capsule Take 1 capsule (500 mg total) by mouth 3 (three) times daily. Patient not taking: Reported on 01/27/2020 07/27/17   Tommi Rumps, PA-C  etonogestrel-ethinyl estradiol (NUVARING) 0.12-0.015 MG/24HR vaginal ring Place 1 each vaginally every 28 (twenty-eight) days. Insert vaginally and leave in place for 3 consecutive weeks, then remove for 1 week.    [provider]     Social History: She  reports that she has never smoked. She has never used smokeless tobacco. She reports that she does not drink alcohol or use drugs.  Family History: family history includes Asthma in her mother; COPD in her maternal grandmother;  Cervical cancer in her mother; Deep vein thrombosis in her maternal grandmother; Diabetes Mellitus II in her maternal grandmother and mother; Heart attack in her maternal grandmother; Hyperlipidemia in her maternal grandmother; Hypertension in her mother; Kidney disease in her maternal grandmother; Osteoarthritis in her maternal grandmother; Osteoporosis in her maternal grandmother; Skin cancer in her mother; Transient ischemic attack in her maternal grandmother.   Review of Systems: A full review of systems was performed and negative except as noted in the HPI.     O:  BP (!) 105/53   Pulse 99   Temp 99.8 F (37.7 C) (Oral)   Resp 18   Ht 5\' 3"  (1.6 m)   Wt 84.4 kg   LMP 06/01/2019   BMI 32.95 kg/m  Results for orders placed or performed during the hospital encounter of 01/27/20 (from the past 48 hour(s))  Lipase, blood   Collection Time: 01/27/20 10:55 AM  Result Value Ref Range   Lipase 25 11 - 51 U/L  Comprehensive metabolic panel   Collection Time: 01/27/20 10:55 AM  Result Value Ref Range   Sodium 136 135 - 145 mmol/L   Potassium 3.9 3.5 - 5.1 mmol/L   Chloride 105 98 - 111 mmol/L   CO2 20 (L) 22 - 32 mmol/L   Glucose, Bld 124 (H) 70 - 99 mg/dL   BUN 14 6 - 20 mg/dL   Creatinine, Ser 01/29/20 0.44 - 1.00 mg/dL   Calcium 8.5 (L) 8.9 - 10.3 mg/dL   Total Protein 6.8 6.5 - 8.1 g/dL   Albumin  3.2 (L) 3.5 - 5.0 g/dL   AST 17 15 - 41 U/L   ALT 12 0 - 44 U/L   Alkaline Phosphatase 97 38 - 126 U/L   Total Bilirubin 0.5 0.3 - 1.2 mg/dL   GFR calc non Af Amer >60 >60 mL/min   GFR calc Af Amer >60 >60 mL/min   Anion gap 11 5 - 15  CBC   Collection Time: 01/27/20 10:55 AM  Result Value Ref Range   WBC 15.3 (H) 4.0 - 10.5 K/uL   RBC 4.32 3.87 - 5.11 MIL/uL   Hemoglobin 12.9 12.0 - 15.0 g/dL   HCT 39.0 36.0 - 46.0 %   MCV 90.3 80.0 - 100.0 fL   MCH 29.9 26.0 - 34.0 pg   MCHC 33.1 30.0 - 36.0 g/dL   RDW 13.4 11.5 - 15.5 %   Platelets 224 150 - 400 K/uL   nRBC 0.0 0.0 - 0.2 %   Amylase   Collection Time: 01/27/20 10:55 AM  Result Value Ref Range   Amylase 70 28 - 100 U/L  Urinalysis, Complete w Microscopic   Collection Time: 01/27/20 12:10 PM  Result Value Ref Range   Color, Urine YELLOW (A) YELLOW   APPearance HAZY (A) CLEAR   Specific Gravity, Urine 1.026 1.005 - 1.030   pH 5.0 5.0 - 8.0   Glucose, UA NEGATIVE NEGATIVE mg/dL   Hgb urine dipstick NEGATIVE NEGATIVE   Bilirubin Urine NEGATIVE NEGATIVE   Ketones, ur 80 (A) NEGATIVE mg/dL   Protein, ur 30 (A) NEGATIVE mg/dL   Nitrite NEGATIVE NEGATIVE   Leukocytes,Ua NEGATIVE NEGATIVE   WBC, UA 0-5 0 - 5 WBC/hpf   Bacteria, UA NONE SEEN NONE SEEN   Squamous Epithelial / LPF 0-5 0 - 5   Mucus PRESENT      Constitutional: NAD, AAOx3  HE/ENT: extraocular movements grossly intact, moist mucous membranes CV: RRR PULM: nl respiratory effort, CTABL     Abd: gravid, non-tender, non-distended, soft      Ext: Non-tender, Nonedmeatous   Psych: mood appropriate, speech normal Pelvic deferred  Fetal monitoring:  Baseline: 150bpm Variability: moderate  Toco: quiet    Assessment: 24 y.o. [redacted]w[redacted]d here for antenatal surveillance during pregnancy.  Principle diagnosis: Gastrointestinal illness  Plan:  Labor: not present.   Fetal Wellbeing: Reassuring Cat 1 tracing.  GI illness symptoms responsive to medications.  Continue these at home PRN.  D/c home stable, precautions reviewed, follow-up as scheduled.   ----- Larey Days, MD Attending Obstetrician and Gynecologist [facility], Department of [facility]

## 2020-01-27 NOTE — OB Triage Note (Signed)
Pt. Presented to L/D triage with reported N/V since 0500 today. Diarrhea began at 0500.She describes it as watery. She reports intermittent lower abdominal and back pain, rated 7/10. Positive fetal movement, no bleeding or LOF. VSS. Monitors applied and assessing.

## 2020-01-27 NOTE — Progress Notes (Signed)
Pt. Reports a relief in nausea with no vomiting since receiving zofran. Still experiencing frequent watery stools. Abdominal tenderness noted, exasperated by diarrhea. Pt. Able to tolerate PO fluids. CNM aware. Pt. stable at this time.

## 2020-04-21 LAB — OB RESULTS CONSOLE GBS: GBS: NEGATIVE

## 2020-04-21 LAB — OB RESULTS CONSOLE GC/CHLAMYDIA
Chlamydia: NEGATIVE
Gonorrhea: NEGATIVE

## 2020-04-21 LAB — OB RESULTS CONSOLE RPR: RPR: NONREACTIVE

## 2020-04-21 LAB — OB RESULTS CONSOLE HIV ANTIBODY (ROUTINE TESTING): HIV: NONREACTIVE

## 2020-05-14 ENCOUNTER — Other Ambulatory Visit: Payer: Self-pay | Admitting: Certified Nurse Midwife

## 2020-05-14 NOTE — Progress Notes (Signed)
  Nichole Little is a 24 y.o. G1P0 female dated by LMP c/w [redacted]w[redacted]d ultrasound.    Pregnancy Issues: 1. Elevated 1h OGTT, 3h OGTT WNL 2. Subchorionic hemorrhage, resolved 3. S<D at 32 weeks, 04/08/2020: EFW 5lb5oz 2406g 36%, AFI 12.50cm 4. Breech at [redacted]w[redacted]d, cephalic on 04/29/2020--confirm position prior to starting IOL   Prenatal care site: [facility]     Prenatal Labs: Blood type/Rh A+  Antibody screen neg  Rubella Immune  Varicella Immune  RPR NR  HBsAg Neg  HIV NR  GC neg  Chlamydia neg  Genetic screening cfDNA negative, XX; AFP negative  1 hour GTT 142  3 hour GTT 95, 180, 173, 122  GBS negative    Post Partum Planning: - Infant feeding: breast - Contraception: NFP  Tdap: 02/26/2020  Flu: 11/05/2019

## 2020-05-16 ENCOUNTER — Encounter: Payer: Self-pay | Admitting: Obstetrics and Gynecology

## 2020-05-16 ENCOUNTER — Inpatient Hospital Stay
Admission: EM | Admit: 2020-05-16 | Discharge: 2020-05-17 | DRG: 807 | Disposition: A | Discharge status: Home / Self Care | Payer: BC Managed Care – PPO | Attending: Certified Nurse Midwife | Admitting: Certified Nurse Midwife

## 2020-05-16 ENCOUNTER — Other Ambulatory Visit: Payer: Self-pay

## 2020-05-16 DIAGNOSIS — E669 Obesity, unspecified: Secondary | ICD-10-CM | POA: Diagnosis present

## 2020-05-16 DIAGNOSIS — Z3A40 40 weeks gestation of pregnancy: Secondary | ICD-10-CM | POA: Diagnosis not present

## 2020-05-16 DIAGNOSIS — O219 Vomiting of pregnancy, unspecified: Secondary | ICD-10-CM

## 2020-05-16 DIAGNOSIS — Z20822 Contact with and (suspected) exposure to covid-19: Secondary | ICD-10-CM | POA: Diagnosis present

## 2020-05-16 DIAGNOSIS — O43123 Velamentous insertion of umbilical cord, third trimester: Secondary | ICD-10-CM | POA: Diagnosis present

## 2020-05-16 DIAGNOSIS — O99214 Obesity complicating childbirth: Secondary | ICD-10-CM | POA: Diagnosis present

## 2020-05-16 DIAGNOSIS — O26893 Other specified pregnancy related conditions, third trimester: Secondary | ICD-10-CM | POA: Diagnosis present

## 2020-05-16 LAB — CBC
HCT: 30.4 % — ABNORMAL LOW (ref 36.0–46.0)
Hemoglobin: 9.9 g/dL — ABNORMAL LOW (ref 12.0–15.0)
MCH: 27 pg (ref 26.0–34.0)
MCHC: 32.6 g/dL (ref 30.0–36.0)
MCV: 82.8 fL (ref 80.0–100.0)
Platelets: 194 10*3/uL (ref 150–400)
RBC: 3.67 MIL/uL — ABNORMAL LOW (ref 3.87–5.11)
RDW: 14.8 % (ref 11.5–15.5)
WBC: 13.1 10*3/uL — ABNORMAL HIGH (ref 4.0–10.5)
nRBC: 0 % (ref 0.0–0.2)

## 2020-05-16 LAB — TYPE AND SCREEN
ABO/RH(D): A POS
Antibody Screen: NEGATIVE

## 2020-05-16 LAB — SARS CORONAVIRUS 2 BY RT PCR (HOSPITAL ORDER, PERFORMED IN ~~LOC~~ HOSPITAL LAB): SARS Coronavirus 2: NEGATIVE

## 2020-05-16 MED ORDER — SIMETHICONE 80 MG PO CHEW
160.0000 mg | CHEWABLE_TABLET | ORAL | Status: DC | PRN
  Filled 2020-05-16: qty 2

## 2020-05-16 MED ORDER — DIPHENHYDRAMINE HCL 25 MG PO CAPS: 25.0000 mg | ORAL_CAPSULE | Freq: Four times a day (QID) | ORAL | Status: DC | PRN

## 2020-05-16 MED ORDER — LACTATED RINGERS IV SOLN: 500.0000 mL | INTRAVENOUS | Status: DC | PRN

## 2020-05-16 MED ORDER — DIBUCAINE (PERIANAL) 1 % EX OINT: 1.0000 "application " | TOPICAL_OINTMENT | CUTANEOUS | Status: DC | PRN

## 2020-05-16 MED ORDER — OXYTOCIN BOLUS FROM INFUSION: 333.0000 mL | Freq: Once | INTRAVENOUS | Status: DC

## 2020-05-16 MED ORDER — ONDANSETRON HCL 4 MG PO TABS: 4.0000 mg | ORAL_TABLET | ORAL | Status: DC | PRN

## 2020-05-16 MED ORDER — BENZOCAINE-MENTHOL 20-0.5 % EX AERO: 1.0000 "application " | INHALATION_SPRAY | CUTANEOUS | Status: DC | PRN

## 2020-05-16 MED ORDER — OXYTOCIN 10 UNIT/ML IJ SOLN
INTRAMUSCULAR | Status: AC
  Filled 2020-05-16: qty 2

## 2020-05-16 MED ORDER — MISOPROSTOL 200 MCG PO TABS
ORAL_TABLET | ORAL | Status: AC
  Filled 2020-05-16: qty 4

## 2020-05-16 MED ORDER — PRENATAL MULTIVITAMIN CH
1.0000 | ORAL_TABLET | Freq: Every day | ORAL | Status: DC
  Administered 2020-05-16: 1 via ORAL
  Filled 2020-05-16: qty 1

## 2020-05-16 MED ORDER — AMMONIA AROMATIC IN INHA
RESPIRATORY_TRACT | Status: AC
  Filled 2020-05-16: qty 10

## 2020-05-16 MED ORDER — ACETAMINOPHEN 325 MG PO TABS: 650.0000 mg | ORAL_TABLET | ORAL | Status: DC | PRN

## 2020-05-16 MED ORDER — TERBUTALINE SULFATE 1 MG/ML IJ SOLN: 0.2500 mg | Freq: Once | INTRAMUSCULAR | Status: DC | PRN

## 2020-05-16 MED ORDER — LIDOCAINE HCL (PF) 1 % IJ SOLN
INTRAMUSCULAR | Status: AC
  Filled 2020-05-16: qty 30

## 2020-05-16 MED ORDER — IBUPROFEN 600 MG PO TABS
600.0000 mg | ORAL_TABLET | Freq: Four times a day (QID) | ORAL | Status: DC
  Administered 2020-05-16 – 2020-05-17 (×3): 600 mg via ORAL
  Filled 2020-05-16 (×3): qty 1

## 2020-05-16 MED ORDER — LIDOCAINE HCL (PF) 1 % IJ SOLN: 30.0000 mL | INTRAMUSCULAR | Status: DC | PRN

## 2020-05-16 MED ORDER — SOD CITRATE-CITRIC ACID 500-334 MG/5ML PO SOLN: 30.0000 mL | ORAL | Status: DC | PRN

## 2020-05-16 MED ORDER — ONDANSETRON HCL 4 MG/2ML IJ SOLN: 4.0000 mg | INTRAMUSCULAR | Status: DC | PRN

## 2020-05-16 MED ORDER — BUTORPHANOL TARTRATE 1 MG/ML IJ SOLN
1.0000 mg | INTRAMUSCULAR | Status: DC | PRN
  Administered 2020-05-16: 1 mg via INTRAVENOUS
  Filled 2020-05-16: qty 1

## 2020-05-16 MED ORDER — OXYTOCIN-SODIUM CHLORIDE 30-0.9 UT/500ML-% IV SOLN
2.5000 [IU]/h | INTRAVENOUS | Status: DC
  Filled 2020-05-16: qty 1000

## 2020-05-16 MED ORDER — LACTATED RINGERS IV SOLN: INTRAVENOUS | Status: DC

## 2020-05-16 MED ORDER — ONDANSETRON HCL 4 MG/2ML IJ SOLN: 4.0000 mg | Freq: Four times a day (QID) | INTRAMUSCULAR | Status: DC | PRN

## 2020-05-16 MED ORDER — WITCH HAZEL-GLYCERIN EX PADS: 1.0000 "application " | MEDICATED_PAD | CUTANEOUS | Status: DC

## 2020-05-16 MED ORDER — FERROUS SULFATE 325 (65 FE) MG PO TABS
325.0000 mg | ORAL_TABLET | Freq: Two times a day (BID) | ORAL | Status: DC
  Administered 2020-05-17: 325 mg via ORAL
  Filled 2020-05-16: qty 1

## 2020-05-16 MED ORDER — COCONUT OIL OIL
1.0000 "application " | TOPICAL_OIL | Status: DC | PRN
  Filled 2020-05-16: qty 120

## 2020-05-16 MED ORDER — SENNOSIDES-DOCUSATE SODIUM 8.6-50 MG PO TABS: 2.0000 | ORAL_TABLET | ORAL | Status: DC

## 2020-05-16 MED ORDER — OXYTOCIN-SODIUM CHLORIDE 30-0.9 UT/500ML-% IV SOLN: 1.0000 m[IU]/min | INTRAVENOUS | Status: DC

## 2020-05-16 NOTE — Progress Notes (Signed)
Labor Progress Note  Nichole Little is a 24 y.o. G3P1011 at [redacted]w[redacted]d by LMP admitted for active labor.  Subjective:  Feeling pressure with contrctions  Objective: BP 112/69 (BP Location: Left Arm)   Pulse 94   Temp 98.1 F (36.7 C) (Axillary)   Resp 18   Ht 5\' 4"  (1.626 m)   Wt 89.4 kg   LMP 08/10/2019 (Exact Date)   BMI 33.81 kg/m   Fetal Assessment: FHT:  FHR: 130 bpm, variability: moderate,  accelerations:  Present,  decelerations:  Absent Category/reactivity:  Category I UC:   regular, every 2-3 minutes SVE:     Dilation: 8cm  Effacement: 90-100%  Station:  -1  Consistency: soft  Position: anterior  Membrane status: AROM now Amniotic color: clear  Labs: Lab Results  Component Value Date   WBC 13.1 (H) 05/16/2020   HGB 9.9 (L) 05/16/2020   HCT 30.4 (L) 05/16/2020   MCV 82.8 05/16/2020   PLT 194 05/16/2020    Assessment / Plan: Spontaneous labor, progressing normally  Labor: Progressing normally, AROM now Fetal Wellbeing:  Category I Pain Control:  Maternal pain control as desired: IVPM, nitrous, regional anesthesia I/D:  n/a Anticipated MOD:  NSVD  07/17/2020, CNM 05/16/2020, 10:00 AM

## 2020-05-16 NOTE — Discharge Summary (Signed)
Obstetric Discharge Summary   Patient Name: Nichole Little DOB: 18-Apr-1996 MRN: [id number]  Date of Admission: 05/16/2020 Date of Delivery: 05/16/2020 Delivered by: Genia Del, CNM Date of Discharge: 05/17/2020  Primary OB: [facility] [id number] last menstrual period was 08/10/2019 (exact date). EDC Estimated Date of Delivery: 05/16/20 Gestational Age at Delivery: [redacted]w[redacted]d   Antepartum complications:  1.Elevated 1h OGTT, 3h OGTT WNL 2. Subchorionic hemorrhage, resolved 3. S<D at 32 weeks, 04/08/2020: EFW 5lb5oz 2406g 36%, AFI 12.50cm 4. Breech at [redacted]w[redacted]d, cephalic on 04/29/2020, cephalic on admission by bedside ultrasound  Admitting Diagnosis: labor  Secondary Diagnoses: Patient Active Problem List   Diagnosis Date Noted  . Labor and delivery indication for care or intervention 05/16/2020  . Obesity (BMI 30-39.9) 01/27/2020  . Nausea/vomiting in pregnancy 01/27/2020  . Encounter for supervision of other normal pregnancy, second trimester 10/31/2019  . Low grade squamous intraepithelial lesion on cytologic smear of cervix (LGSIL) 06/26/2017    Augmentation: AROM Complications: None Intrapartum complications/course: She  presented to L&D with contractions. She was augmented with AROM. IV pain medication given for pain relief. She progressed to complete and had a spontaneous vaginal birth of a live female, pushing over an intact perineum. The fetal head was delivered in direct OA position with restitution to ROA. No nuchal cord. Anterior then posterior shoulders delivered with minimal assistance. Baby placed on mom's abdomen and attended to by transition RN. Cord clamped and cut when pulseless by father of the baby. Placenta delivered spontaneously intact with 3VC at 1108. One true knot and velamentous cord insertion. IM pitocin given for hemorrhage prophylaxis due to loss of IV access. 1st degree perineal and b/l labial lacerations identified. 1st degree repaired with 3-0  Vicryl Rapide CT. Est. Blood Loss (mL): 672.  Delivery Type: spontaneous vaginal delivery Anesthesia: IV pain meds Placenta: spontaneous Laceration: 1st degree perineal, b/l labial Episiotomy: none  Newborn Data: Live born female  Birth Weight: 3740g 8lb3.9oz  APGAR: 8, 9  Newborn Delivery   Birth date/time: 05/16/2020 10:59:00 Delivery type: Vaginal, Spontaneous       Postpartum Course  Patient had an uncomplicated postpartum course.  By time of discharge on PPD#1, her pain was controlled on oral pain medications; she had appropriate lochia and was ambulating, voiding without difficulty and tolerating regular diet.  She was deemed stable for discharge to home.       Edinburgh:   Edinburgh Postnatal Depression Scale Screening Tool 05/17/2020 05/16/2020  I have been able to laugh and see the funny side of things. 0 (No Data)  I have looked forward with enjoyment to things. 0 -  I have blamed myself unnecessarily when things went wrong. 1 -  I have been anxious or worried for no good reason. 0 -  I have felt scared or panicky for no good reason. 0 -  Things have been getting on top of me. 1 -  I have been so unhappy that I have had difficulty sleeping. 0 -  I have felt sad or miserable. 0 -  I have been so unhappy that I have been crying. 0 -  The thought of harming myself has occurred to me. 0 -  Edinburgh Postnatal Depression Scale Total 2 -     Labs: CBC Latest Ref Rng & Units 05/17/2020 05/16/2020 01/27/2020  WBC 4.0 - 10.5 K/uL 14.4(H) 13.1(H) 15.3(H)  Hemoglobin 12.0 - 15.0 g/dL 7.5(T) 7.0(Y) 17.4  Hematocrit 36 - 46 % 27.4(L) 30.4(L) 39.0  Platelets 150 -  400 K/uL 181 194 224   A POS  Physical exam:  BP 107/71 (BP Location: Left Arm)   Pulse 75   Temp 98.4 F (36.9 C) (Oral)   Resp 20   Ht 5\' 4"  (1.626 m)   Wt 89.4 kg   LMP 08/10/2019 (Exact Date)   SpO2 99%   Breastfeeding Unknown   BMI 33.81 kg/m  General: alert and no distress Pulm: normal respiratory  effort Lochia: appropriate Abdomen: soft, NT Uterine Fundus: firm, below umbilicus Extremities: No evidence of DVT seen on physical exam. No lower extremity edema.   Disposition: stable, discharge to home Baby Feeding: breastmilk Baby Disposition: home with mom  Contraception: NFP  Prenatal Labs:  Blood type/Rh A+  Antibody screen neg  Rubella Immune  Varicella Immune  RPR NR  HBsAg Neg  HIV NR  GC neg  Chlamydia neg  Genetic screening cfDNA negative, XX; AFP negative  1 hour GTT 142  3 hour GTT 98, 180, 173, 122  GBS negative   Rh Immune globulin given: n/a Rubella vaccine given: n/a Varicella vaccine given: n/a Tdap vaccine given in AP or PP setting: 02/26/2020 Flu vaccine given in AP or PP setting: 11/05/2019  Plan:  Kellyann Ordway was discharged to home in good condition. Follow-up appointment at [facility] with delivery provider in 6 weeks  Discharge Instructions: Per After Visit Summary. Activity: Advance as tolerated. Pelvic rest for 6 weeks.   Diet: Regular Discharge Medications: Allergies as of 05/17/2020      Reactions   Penicillins Hives   Metronidazole And Related Hives, Rash      Medication List    STOP taking these medications   loperamide 2 MG capsule Commonly known as: IMODIUM   ondansetron 4 MG disintegrating tablet Commonly known as: ZOFRAN-ODT     TAKE these medications   ferrous sulfate 325 (65 FE) MG tablet Take 1 tablet (325 mg total) by mouth daily with breakfast.   ibuprofen 600 MG tablet Commonly known as: ADVIL Take 1 tablet (600 mg total) by mouth every 6 (six) hours as needed for mild pain, moderate pain or cramping.   multivitamin-prenatal 27-0.8 MG Tabs tablet Take 1 tablet by mouth daily at 12 noon.      Outpatient follow up:   Follow-up Information    07/18/2020, CNM. Schedule an appointment as soon as possible for a visit in 6 week(s).   Specialty: Certified Nurse Midwife Why: For routine  postpartum visit Contact information: [address] [place] [phone]                Signed: [phone], CNM 05/17/2020 12:27 PM

## 2020-05-16 NOTE — Plan of Care (Signed)
  Problem: Education: Goal: Knowledge of General Education information will improve Description Including pain rating scale, medication(s)/side effects and non-pharmacologic comfort measures Outcome: Progressing   

## 2020-05-16 NOTE — Lactation Note (Signed)
This note was copied from a baby's chart. Lactation Consultation Note  Patient Name: Nichole Little WNUUV'O Date: 05/16/2020 Reason for consult: Follow-up assessment;Mother's request;Other (Comment) (Sleepy for this BF, but observed good breastfeedings before)  Baby was sleepy for this breast feed.  Woke up when changing pamper, but kept going back to sleep when got her to the breast.  She did take a few sucks with audible swallows.  Mom can hand express colostrum.  Demonstrated how to sandwich breast.  The first breast feeds that LC assisted went well with good deep latch, strong rhythmic sucking and audible swallows.  Mom only breast fed her first baby in the hospital once.  After that she could not get her to latch and had no help or support.  Once she started giving bottles, first baby did not want to go back to the breast.  That is why LC has helped mom and reassured her with every feeding.  Discouraged giving bottles in the beginning.  Discussed supply and demand and encouraged putting her to the breast whenever she demonstrated feeding cues to bring in mature milk, ensure a plentiful milk supply and prevent engorgement.  Reviewed normal newborn stomach size, adequate intake and output, normal course of lactation and routine newborn feeding patterns.  Lactation name and number written on white board and encouraged to call with any questions, concerns or assistance. Maternal Data Formula Feeding for Exclusion: No Has patient been taught Hand Expression?: Yes Does the patient have breastfeeding experience prior to this delivery?: Yes  Feeding Feeding Type: Breast Fed  LATCH Score Latch: Repeated attempts needed to sustain latch, nipple held in mouth throughout feeding, stimulation needed to elicit sucking reflex.  Audible Swallowing: A few with stimulation  Type of Nipple: Everted at rest and after stimulation  Comfort (Breast/Nipple): Soft / non-tender  Hold (Positioning):  Assistance needed to correctly position infant at breast and maintain latch.  LATCH Score: 7  Interventions Interventions: Breast feeding basics reviewed;Assisted with latch;Breast massage;Hand express;Breast compression;Adjust position;Support pillows;Position options  Lactation Tools Discussed/Used WIC Program: No [employer])   Consult Status Consult Status: Follow-up Follow-up type: Call as needed    Louis Meckel 05/16/2020, 7:25 PM

## 2020-05-16 NOTE — Discharge Instructions (Signed)
After Your Delivery Discharge Instructions   Postpartum: Care Instructions  After childbirth (postpartum period), your body goes through many changes. Some of these changes happen over several weeks. In the hours after delivery, your body will begin to recover from childbirth while it prepares to breastfeed your newborn. You may feel emotional during this time. Your hormones can shift your mood without warning for no clear reason.  In the first couple of weeks after childbirth, many women have emotions that change from happy to sad. You may find it hard to sleep. You may cry a lot. This is called the "baby blues." These overwhelming emotions often go away within a couple of days or weeks. But it's important to discuss your feelings with your doctor.  You should call your care provider if you have unrelieved feelings of:  Inability to cope  Sadness  Anxiety  Lack of interest in baby  Insomnia  Crying  It is easy to get too tired and overwhelmed during the first weeks after childbirth. Don't try to do too much. Get rest whenever you can, accept help from others, and eat well and drink plenty of fluids.  About 4 to 6 weeks after your baby's birth, you will have a follow-up visit with your care provider. This visit is your time to talk to your provider about anything you are concerned or curious about.  Follow-up care is a key part of your treatment and safety. Be sure to make and go to all appointments, and call your doctor if you are having problems. It's also a good idea to know your test results and keep a list of the medicines you take.  How can you care for yourself at home?  Sleep or rest when your baby sleeps.  Get help with household chores from family or friends, if you can. Do not try to do it all yourself.  If you have hemorrhoids or swelling or pain around the opening of your vagina, try using cold and heat. You can put ice or a cold pack on the area for 10 to 20 minutes at  a time. Put a thin cloth between the ice and your skin. Also try sitting in a few inches of warm water (sitz bath) 3 times a day and after bowel movements.  Take pain medicines exactly as directed.  If the provider gave you a prescription medicine for pain, take it as prescribed.  If you do not have a prescription and need something over the counter, you can take:  Ibuprofen (Motrin, Advil) up to 600mg every 6 hours as needed for pain  Acetaminophen (Tylenol) up to 650mg every 4 hours as needed for pain  Some people find it helpful to alternate between these two medications.   No driving for 1-2 weeks or while taking pain medications.   Eat more fiber to avoid constipation. Include foods such as whole-grain breads and cereals, raw vegetables, raw and dried fruits, and beans.  Drink plenty of fluids, enough so that your urine is light yellow or clear like water. If you have kidney, heart, or liver disease and have to limit fluids, talk with your doctor before you increase the amount of fluids you drink.  Do not put anything in the vagina for 6 weeks. This means no sex, no tampons, no douching, and no enemas.  If you have stitches, keep the area clean by pouring or spraying warm water over the area outside your vagina and anus after you use the toilet.    No strenuous activity or heavy lifting for 6 weeks   No tub baths; showers only  Continue prenatal vitamin and iron.  If breastfeeding:  Increase calories and fluids while breastfeeding.  You may have a slight fever when your milk comes in, but it should go away on its own. If it does not, and rises above 101.0 please call the doctor.  For breastfeeding concerns, the lactation consultant can be reached at [phone].  For concerns about your baby, please call your pediatrician.   Keep a list of questions to bring to your postpartum visit. Your questions might be about:  Changes in your breasts, such as lumps or  soreness.  When to expect your menstrual period to start again.  What form of birth control is best for you.  Weight you have put on during the pregnancy.  Exercise options.  What foods and drinks are best for you, especially if you are breastfeeding.  Problems you might be having with breastfeeding.  When you can have sex. Some women may want to talk about lubricants for the vagina.  Any feelings of sadness or restlessness that you are having.   When should you call for help?  Call 911 anytime you think you may need emergency care. For example, call if:  You have thoughts of harming yourself, your baby, or another person.  You passed out (lost consciousness).  Call the office at [phone] or seek immediate medical care if:  If you have heavy bleeding such that you are soaking 1 pad in an hour for 2 hours  You are dizzy or lightheaded, or you feel like you may faint.  You have a fever; a temperature of 101.0 F or greater  Chills  Difficulty urinating  Headache unrelieved by "pain meds"   Visual changes  Pain in the right side of your belly near your ribs  Breasts reddened, hard, hot to the touch or any other breast concerns  Nipple discharge which is foul-smelling or contains pus   Increased pain at the site of the laceration   New pain unrelieved with recommended over-the-counter dosages  Difficulty breathing with or without chest pain   New leg pain, swelling, or redness, especially if it is only on one leg  Any other concerns  Watch closely for changes in your health, and be sure to contact your provider if:  You have new or worse vaginal discharge.  You feel sad or depressed.  You are having problems with your breasts or breastfeeding.    

## 2020-05-16 NOTE — H&P (Signed)
OB History & Physical   History of Present Illness:  Chief Complaint: contractions  HPI:  Nichole Little is a 24 y.o. G39P1011 female at [redacted]w[redacted]d dated by LMP c/w [redacted]w[redacted]d ultrasound.  She presents to L&D with contractions since 0500 today, every few minutes.  She reports:  -active fetal movement -no leakage of fluid  -no vaginal bleeding  Pregnancy Issues: 1. Elevated 1h OGTT, 3h OGTT WNL 2. Subchorionic hemorrhage, resolved 3. S<D at 32 weeks, 04/08/2020: EFW 5lb5oz 2406g 36%, AFI 12.50cm 4. Breech at [redacted]w[redacted]d, cephalic on 04/29/2020, cephalic today by bedside ultrasound   Maternal Medical History:   Past Medical History:  Diagnosis Date  . Medical history non-contributory     Past Surgical History:  Procedure Laterality Date  . NO PAST SURGERIES      Allergies  Allergen Reactions  . Penicillins Hives  . Metronidazole And Related Hives and Rash    Prior to Admission medications   Medication Sig Start Date End Date Taking? Authorizing Provider  Prenatal Vit-Fe Fumarate-FA (MULTIVITAMIN-PRENATAL) 27-0.8 MG TABS tablet Take 1 tablet by mouth daily at 12 noon.   Yes [provider]  loperamide (IMODIUM) 2 MG capsule Take 1 capsule (2 mg total) by mouth as needed for diarrhea or loose stools. Patient not taking: Reported on 05/16/2020 01/27/20   Ward, Elenora Fender, MD  ondansetron (ZOFRAN-ODT) 4 MG disintegrating tablet Take 1 tablet (4 mg total) by mouth every 8 (eight) hours as needed for nausea. Patient not taking: Reported on 05/16/2020 01/27/20   Ward, Elenora Fender, MD     Prenatal care site: [facility]    Social History: She  reports that she has never smoked. She has never used smokeless tobacco. She reports that she does not drink alcohol and does not use drugs.  Family History: family history includes Asthma in her mother; COPD in her maternal grandmother; Cervical cancer in her mother; Deep vein thrombosis in her maternal grandmother; Diabetes Mellitus II in  her maternal grandmother and mother; Heart attack in her maternal grandmother; Hyperlipidemia in her maternal grandmother; Hypertension in her mother; Kidney disease in her maternal grandmother; Osteoarthritis in her maternal grandmother; Osteoporosis in her maternal grandmother; Skin cancer in her mother; Transient ischemic attack in her maternal grandmother.   Review of Systems: A full review of systems was performed and negative except as noted in the HPI.    Physical Exam:  Vital Signs: LMP 08/10/2019 (Exact Date)   General:   alert, cooperative, appears stated age and moderate distress  Skin:  normal and no rash or abnormalities  Neurologic:    Alert & oriented x 3  Lungs:   clear to auscultation bilaterally  Heart:   regular rate and rhythm, S1, S2 normal, no murmur, click, rub or gallop  Abdomen:  soft, non-tender; bowel sounds normal; no masses,  no organomegaly  Pelvis:  External genitalia: normal general appearance  FHT:  130 BPM  Presentations: cephalic  Cervix:    Dilation: 5-6cm   Effacement: 80%   Station:  -2   Consistency: soft   Position: middle  Extremities: : non-tender, symmetric, no edema bilaterally.    EFW: 7lb2oz  No results found for this or any previous visit (from the past 24 hour(s)).  Pertinent Results:  Prenatal Labs: Blood type/Rh A+  Antibody screen neg  Rubella Immune  Varicella Immune  RPR NR  HBsAg Neg  HIV NR  GC neg  Chlamydia neg  Genetic screening cfDNA negative, XX; AFP negative  1 hour GTT 142  3 hour GTT 95, 180, 173, 122  GBS negative   FHT: FHR: 130 bpm, variability: moderate,  accelerations:  Present,  decelerations:  Absent Category/reactivity:  Category I TOCO: regular, every 2-3 minutes, not tracing well on toco   Assessment:  Nichole Little is a 24 y.o. G20P1011 female at [redacted]w[redacted]d with spontaneous labor.   Plan:  1. Admit to Labor & Delivery; consents reviewed and obtained  2. Fetal Well being  - Fetal Tracing:  category I - GBS negative - Presentation: cephalic confirmed by bedside ultrasound   3. Routine OB: - Prenatal labs reviewed, as above - Rh positive - CBC & T&S on admit - Clear fluids, IVF  4. Monitoring of Labor: -  Contractions monitored with external toco in place -  Pelvis proven to 3365g -  Plan for expectant management -  Maternal pain control as desired: IVPM, nitrous, regional anesthesia - Anticipate vaginal delivery  5. Post Partum Planning: - Infant feeding: breast - Contraception: NFP  Genia Del, CNM 05/16/2020 8:22 AM ----- Genia Del Certified Nurse Midwife [facility], Department of OB/GYN [facility]

## 2020-05-17 LAB — CBC
HCT: 27.4 % — ABNORMAL LOW (ref 36.0–46.0)
Hemoglobin: 9.1 g/dL — ABNORMAL LOW (ref 12.0–15.0)
MCH: 27.1 pg (ref 26.0–34.0)
MCHC: 33.2 g/dL (ref 30.0–36.0)
MCV: 81.5 fL (ref 80.0–100.0)
Platelets: 181 10*3/uL (ref 150–400)
RBC: 3.36 MIL/uL — ABNORMAL LOW (ref 3.87–5.11)
RDW: 15.1 % (ref 11.5–15.5)
WBC: 14.4 10*3/uL — ABNORMAL HIGH (ref 4.0–10.5)
nRBC: 0 % (ref 0.0–0.2)

## 2020-05-17 LAB — RPR: RPR Ser Ql: NONREACTIVE

## 2020-05-17 MED ORDER — IBUPROFEN 600 MG PO TABS: 600.0000 mg | ORAL_TABLET | Freq: Four times a day (QID) | ORAL | 0 refills | Status: DC | PRN

## 2020-05-17 MED ORDER — FERROUS SULFATE 325 (65 FE) MG PO TABS: 325.0000 mg | ORAL_TABLET | Freq: Every day | ORAL | 0 refills | Status: DC

## 2020-05-17 NOTE — Progress Notes (Signed)
Discharge instructions complete and prescriptions sent to the pharmacy by the provider. Patient verbalizes understanding of teaching. Patient discharged home via wheelchair at 1330.

## 2020-05-21 ENCOUNTER — Encounter: Payer: Self-pay | Admitting: Certified Nurse Midwife

## 2021-07-01 DIAGNOSIS — Z3493 Encounter for supervision of normal pregnancy, unspecified, third trimester: Secondary | ICD-10-CM | POA: Insufficient documentation

## 2021-07-15 LAB — OB RESULTS CONSOLE VARICELLA ZOSTER ANTIBODY, IGG: Varicella: IMMUNE

## 2021-07-15 LAB — OB RESULTS CONSOLE HEPATITIS B SURFACE ANTIGEN: Hepatitis B Surface Ag: NEGATIVE

## 2021-07-15 LAB — OB RESULTS CONSOLE RUBELLA ANTIBODY, IGM: Rubella: IMMUNE

## 2021-11-14 NOTE — L&D Delivery Note (Signed)
Delivery Note ? ?Nichole Little is a G4P2012 at [redacted]w[redacted]d, Patient's last menstrual period was 04/25/2021 (exact date). , consistent with US at [redacted]w[redacted]d.  ? ?First Stage: ?Labor onset: 0000 ?Augmentation: AROM ?Analgesia /Anesthesia intrapartum: IVPM ?AROM at 0532 ?GBS: negative ? ?Second Stage: ?Complete dilation at 0624 ?Onset of pushing at 0620 ?FHR second stage: FHR intermittently monitored during active labor, FHR 120-130's, no decels auscultated  ? ?Tanique presented to L&D in active labor.  She was augmented with AROM and progressed to 9.5/100/+2 with a strong urge to push.  Anterior lip was easily reduced and she pushed quickly over 9 minutes for a spontaneous vaginal birth.   ?Delivery of a viable baby boy on 02/08/2022 at 0629 by CNM ?Delivery of fetal head in OA position with restitution to ROT. ?No nuchal cord;  Anterior then posterior shoulders delivered easily with gentle downward traction. Baby placed on mom's chest, and attended to by baby RN ?Cord double clamped after cessation of pulsation, cut by father of baby. ? ?Cord blood sample collection: Not Indicated A POS ? ?Third Stage: ?Oxytocin bolus started after delivery of infant for hemorrhage prophylaxis  ?Placenta delivered intact with 3 VC @ 1837 ?Placenta disposition: discarded  ?Uterine tone firm with massage -> firm/ bleeding large ->moderate  ? ?Brisk bleeding noted just prior to delivery of placenta with several large clots expressed after placenta.  Uterus initially firm with massage with large gushes.  Methergine 0.2mg IM given.  Uterus became firm with small to moderate lochia.   ? ?Periurethral and 1st degree vaginal laceration identified, hemostatic, approximates well ?Anesthesia for repair: N/A ?Repair not indicated  ?Est. Blood Loss (mL): 700 ml ? ?Complications: None ? ?Mom to postpartum.  Baby to Couplet care / Skin to Skin. ? ?Newborn: ?Information for the patient's newborn:  Cristo, Boy Marianita [[id number]]  ?Live born female  "Beau" ?Birth Weight:  Pending  ?APGAR: 8, 9 ? ?Newborn Delivery   ?Birth date/time: 02/[birth year]2022 06:29:00 ?Delivery type: Vaginal, Spontaneous ?  ?  ?Feeding planned: breast ? ?---------- ?Tamarius Rosenfield, CNM ?Certified Nurse Midwife ?[facility] ?[facility]  ? ?

## 2022-01-04 ENCOUNTER — Observation Stay
Admission: EM | Admit: 2022-01-04 | Discharge: 2022-01-04 | Disposition: A | Discharge status: Home / Self Care | Payer: BC Managed Care – PPO | Attending: Obstetrics and Gynecology | Admitting: Obstetrics and Gynecology

## 2022-01-04 ENCOUNTER — Encounter: Payer: Self-pay | Admitting: Obstetrics and Gynecology

## 2022-01-04 ENCOUNTER — Other Ambulatory Visit: Payer: Self-pay

## 2022-01-04 DIAGNOSIS — O4703 False labor before 37 completed weeks of gestation, third trimester: Secondary | ICD-10-CM | POA: Diagnosis present

## 2022-01-04 DIAGNOSIS — Z3A36 36 weeks gestation of pregnancy: Secondary | ICD-10-CM | POA: Diagnosis not present

## 2022-01-04 DIAGNOSIS — O479 False labor, unspecified: Secondary | ICD-10-CM | POA: Diagnosis present

## 2022-01-04 NOTE — OB Triage Note (Signed)
Patient is a  26 yo, G4P2, at 36 weeks 2 days. Patient presents with complaints of contractions rated 7/10 every 3 minutes that have progressively gotten worse since approximately 2300. Patient denies any vaginal bleeding or LOF and reports +FM. Patient reports having intercourse in the last 24 hours. Monitors applied and assessing. VSS. Initial fetal heart tone 140. Will continue to monitor.

## 2022-01-04 NOTE — Discharge Summary (Signed)
Nichole Little is a 26 y.o. female. She is at [redacted]w[redacted]d gestation. Patient's last menstrual period was 04/25/2021. Estimated Date of Delivery: 01/30/22  Prenatal care site: [facility]  Chief complaint: contractions  HPI: Nichole Little presents to L&D with complaints of contractions that have been irregular all day.  They were about 3 minutes apart around 2300 and felt like they were getting more intense.  Denies vaginal bleeding or LOF.  Reports recent intercourse.  Endorses good fetal movement.  States that contractions have spaced out since arrival to L&D.   S: Resting comfortably. no CTX, no VB.no LOF,  Active fetal movement.   Maternal Medical History:  Past Medical Hx:  has a past medical history of Medical history non-contributory.    Past Surgical Hx:  has a past surgical history that includes No past surgeries.   Allergies  Allergen Reactions   Penicillins Hives   Metronidazole And Related Hives and Rash     Prior to Admission medications   Medication Sig Start Date End Date Taking? Authorizing Provider  Prenatal Vit-Fe Fumarate-FA (MULTIVITAMIN-PRENATAL) 27-0.8 MG TABS tablet Take 1 tablet by mouth daily at 12 noon.   Yes [provider]  ferrous sulfate 325 (65 FE) MG tablet Take 1 tablet (325 mg total) by mouth daily with breakfast. Patient not taking: Reported on 01/04/2022 05/17/20   Genia Del, CNM  ibuprofen (ADVIL) 600 MG tablet Take 1 tablet (600 mg total) by mouth every 6 (six) hours as needed for mild pain, moderate pain or cramping. Patient not taking: Reported on 01/04/2022 05/17/20   Genia Del, CNM    Social History: She  reports that she has never smoked. She has never used smokeless tobacco. She reports that she does not drink alcohol and does not use drugs.  Family History: family history includes Asthma in her mother; COPD in her maternal grandmother; Cervical cancer in her mother; Deep vein thrombosis in her maternal grandmother;  Diabetes Mellitus II in her maternal grandmother and mother; Heart attack in her maternal grandmother; Hyperlipidemia in her maternal grandmother; Hypertension in her mother; Kidney disease in her maternal grandmother; Osteoarthritis in her maternal grandmother; Osteoporosis in her maternal grandmother; Skin cancer in her mother; Transient ischemic attack in her maternal grandmother.   Review of Systems: A full review of systems was performed and negative except as noted in the HPI.    O:  BP 113/61 (BP Location: Left Arm)    Pulse 87    Temp 98 F (36.7 C) (Oral)    Resp 16    LMP 04/25/2021  No results found for this or any previous visit (from the past 48 hour(s)).   Constitutional: NAD, AAOx3  CV: RRR PULM: nl respiratory effort Abd: gravid,soft  Ext: Non-tender, Nonedmeatous Psych: mood appropriate, speech normal SVE: Dilation: 1 Effacement (%): Thick Cervical Position: Posterior Exam by:: Rella Larve RN   Fetal Monitor: Baseline: 130 bpm Variability: moderate Accels: Present Decels: none Toco: irregular, mild contractions   Category: I NST: Reactive    Assessment: 26 y.o. [redacted]w[redacted]d here for antenatal surveillance during pregnancy.  Principle diagnosis: Uterine contractions during pregnancy [O47.9]   Plan: Labor: not present.  NST reviewed  Fetal Wellbeing: Reassuring Cat 1 tracing. Reactive NST  D/c home stable, precautions reviewed, follow-up as scheduled.   ----- Margaretmary Eddy, CNM Certified Nurse Midwife [facility]

## 2022-01-04 NOTE — Progress Notes (Signed)
Discharge instructions provided to patient. Patient verbalized understanding. Pt educated on signs and symptoms of labor, vaginal bleeding, LOF, and fetal movement. Red flag signs reviewed by RN. Patient discharged home with significant other in stable condition.  

## 2022-01-11 LAB — OB RESULTS CONSOLE HIV ANTIBODY (ROUTINE TESTING): HIV: NONREACTIVE

## 2022-01-11 LAB — OB RESULTS CONSOLE GBS: GBS: NEGATIVE

## 2022-01-11 LAB — OB RESULTS CONSOLE RPR: RPR: NONREACTIVE

## 2022-02-07 ENCOUNTER — Other Ambulatory Visit: Payer: Self-pay

## 2022-02-07 DIAGNOSIS — O480 Post-term pregnancy: Secondary | ICD-10-CM

## 2022-02-07 NOTE — Progress Notes (Signed)
[id number] at [redacted]w[redacted]d, LMP of 04/25/2021, c/w early [place] at [redacted]w[redacted]d.  ?Scheduled for induction of labor for postterm pregnancy on 02/08/2022 at 0800.  ? ?Prenatal provider: [facility] ?Pregnancy complicated by: ?Elevated 1hr GTT - 3hr GTT WNL ? ?Prenatal Labs: ?Blood type/Rh A pos  ?Antibody screen neg  ?Rubella Immune  ?Varicella Immune  ?RPR NR  ?HBsAg Neg  ?HIV NR  ?GC neg  ?Chlamydia neg  ?Genetic screening cfDNA negative  ?1 hour GTT 176  ?3 hour GTT 81, 137, 127, 101  ?GBS Negative   ? ?Tdap: 11/10/2021 ?Flu: 09/10/2021 ?Contraception: NFP ?Feeding preference: breast  ? ?____ ?Margaretmary Eddy, CNM ?Certified Nurse Midwife ?[facility] ?[facility]  ? ?

## 2022-02-08 ENCOUNTER — Other Ambulatory Visit: Payer: Self-pay

## 2022-02-08 ENCOUNTER — Inpatient Hospital Stay
Admission: EM | Admit: 2022-02-08 | Discharge: 2022-02-09 | DRG: 807 | Disposition: A | Discharge status: Home / Self Care | Payer: BC Managed Care – PPO | Attending: Obstetrics | Admitting: Obstetrics

## 2022-02-08 ENCOUNTER — Encounter: Payer: Self-pay | Admitting: Obstetrics and Gynecology

## 2022-02-08 DIAGNOSIS — Z3A41 41 weeks gestation of pregnancy: Secondary | ICD-10-CM | POA: Diagnosis not present

## 2022-02-08 DIAGNOSIS — O26843 Uterine size-date discrepancy, third trimester: Secondary | ICD-10-CM | POA: Insufficient documentation

## 2022-02-08 DIAGNOSIS — O99214 Obesity complicating childbirth: Secondary | ICD-10-CM | POA: Diagnosis present

## 2022-02-08 DIAGNOSIS — O480 Post-term pregnancy: Principal | ICD-10-CM | POA: Diagnosis present

## 2022-02-08 LAB — TYPE AND SCREEN
ABO/RH(D): A POS
Antibody Screen: NEGATIVE

## 2022-02-08 LAB — CBC
HCT: 38 % (ref 36.0–46.0)
Hemoglobin: 12.8 g/dL (ref 12.0–15.0)
MCH: 30 pg (ref 26.0–34.0)
MCHC: 33.7 g/dL (ref 30.0–36.0)
MCV: 89.2 fL (ref 80.0–100.0)
Platelets: 145 10*3/uL — ABNORMAL LOW (ref 150–400)
RBC: 4.26 MIL/uL (ref 3.87–5.11)
RDW: 14.2 % (ref 11.5–15.5)
WBC: 10.7 10*3/uL — ABNORMAL HIGH (ref 4.0–10.5)
nRBC: 0 % (ref 0.0–0.2)

## 2022-02-08 LAB — RPR: RPR Ser Ql: NONREACTIVE

## 2022-02-08 MED ORDER — OXYTOCIN 10 UNIT/ML IJ SOLN
INTRAMUSCULAR | Status: AC
  Filled 2022-02-08: qty 2

## 2022-02-08 MED ORDER — ACETAMINOPHEN 500 MG PO TABS
1000.0000 mg | ORAL_TABLET | Freq: Four times a day (QID) | ORAL | Status: DC | PRN
  Administered 2022-02-08 (×2): 1000 mg via ORAL
  Filled 2022-02-08 (×2): qty 2

## 2022-02-08 MED ORDER — SIMETHICONE 80 MG PO CHEW: 80.0000 mg | CHEWABLE_TABLET | ORAL | Status: DC | PRN

## 2022-02-08 MED ORDER — DIBUCAINE (PERIANAL) 1 % EX OINT: 1.0000 "application " | TOPICAL_OINTMENT | CUTANEOUS | Status: DC | PRN

## 2022-02-08 MED ORDER — LIDOCAINE HCL (PF) 1 % IJ SOLN: 30.0000 mL | INTRAMUSCULAR | Status: DC | PRN

## 2022-02-08 MED ORDER — LIDOCAINE HCL (PF) 1 % IJ SOLN
INTRAMUSCULAR | Status: AC
  Filled 2022-02-08: qty 30

## 2022-02-08 MED ORDER — OXYTOCIN BOLUS FROM INFUSION
333.0000 mL | Freq: Once | INTRAVENOUS | Status: DC
  Administered 2022-02-08: 333 mL via INTRAVENOUS

## 2022-02-08 MED ORDER — BENZOCAINE-MENTHOL 20-0.5 % EX AERO
1.0000 "application " | INHALATION_SPRAY | CUTANEOUS | Status: DC | PRN
  Administered 2022-02-08: 1 via TOPICAL
  Filled 2022-02-08: qty 56

## 2022-02-08 MED ORDER — METHYLERGONOVINE MALEATE 0.2 MG/ML IJ SOLN: 0.2000 mg | Freq: Once | INTRAMUSCULAR | Status: AC

## 2022-02-08 MED ORDER — FENTANYL CITRATE (PF) 100 MCG/2ML IJ SOLN
50.0000 ug | INTRAMUSCULAR | Status: DC | PRN
  Administered 2022-02-08: 100 ug via INTRAVENOUS
  Filled 2022-02-08: qty 2

## 2022-02-08 MED ORDER — ONDANSETRON HCL 4 MG PO TABS: 4.0000 mg | ORAL_TABLET | ORAL | Status: DC | PRN

## 2022-02-08 MED ORDER — DIPHENHYDRAMINE HCL 25 MG PO CAPS: 25.0000 mg | ORAL_CAPSULE | Freq: Four times a day (QID) | ORAL | Status: DC | PRN

## 2022-02-08 MED ORDER — ACETAMINOPHEN 500 MG PO TABS: 1000.0000 mg | ORAL_TABLET | Freq: Four times a day (QID) | ORAL | Status: DC | PRN

## 2022-02-08 MED ORDER — MISOPROSTOL 200 MCG PO TABS
ORAL_TABLET | ORAL | Status: AC
  Filled 2022-02-08: qty 4

## 2022-02-08 MED ORDER — OXYTOCIN-SODIUM CHLORIDE 30-0.9 UT/500ML-% IV SOLN
2.5000 [IU]/h | INTRAVENOUS | Status: DC
  Filled 2022-02-08: qty 500

## 2022-02-08 MED ORDER — PRENATAL MULTIVITAMIN CH: 1.0000 | ORAL_TABLET | Freq: Every day | ORAL | Status: DC

## 2022-02-08 MED ORDER — TERBUTALINE SULFATE 1 MG/ML IJ SOLN: 0.2500 mg | Freq: Once | INTRAMUSCULAR | Status: DC | PRN

## 2022-02-08 MED ORDER — OXYTOCIN-SODIUM CHLORIDE 30-0.9 UT/500ML-% IV SOLN: 1.0000 m[IU]/min | INTRAVENOUS | Status: DC

## 2022-02-08 MED ORDER — ONDANSETRON HCL 4 MG/2ML IJ SOLN: 4.0000 mg | Freq: Four times a day (QID) | INTRAMUSCULAR | Status: DC | PRN

## 2022-02-08 MED ORDER — METHYLERGONOVINE MALEATE 0.2 MG/ML IJ SOLN
INTRAMUSCULAR | Status: AC
  Administered 2022-02-08: 0.2 mg via INTRAMUSCULAR
  Filled 2022-02-08: qty 1

## 2022-02-08 MED ORDER — WITCH HAZEL-GLYCERIN EX PADS
1.0000 "application " | MEDICATED_PAD | CUTANEOUS | Status: DC
  Administered 2022-02-08: 1 via TOPICAL
  Filled 2022-02-08: qty 100

## 2022-02-08 MED ORDER — IBUPROFEN 600 MG PO TABS
600.0000 mg | ORAL_TABLET | Freq: Four times a day (QID) | ORAL | Status: DC
  Administered 2022-02-08 – 2022-02-09 (×4): 600 mg via ORAL
  Filled 2022-02-08 (×5): qty 1

## 2022-02-08 MED ORDER — COCONUT OIL OIL: 1.0000 "application " | TOPICAL_OIL | Status: DC | PRN

## 2022-02-08 MED ORDER — AMMONIA AROMATIC IN INHA
RESPIRATORY_TRACT | Status: AC
  Filled 2022-02-08: qty 10

## 2022-02-08 MED ORDER — SOD CITRATE-CITRIC ACID 500-334 MG/5ML PO SOLN: 30.0000 mL | ORAL | Status: DC | PRN

## 2022-02-08 MED ORDER — LACTATED RINGERS IV SOLN: INTRAVENOUS | Status: DC

## 2022-02-08 MED ORDER — DOCUSATE SODIUM 100 MG PO CAPS
100.0000 mg | ORAL_CAPSULE | Freq: Two times a day (BID) | ORAL | Status: DC
  Administered 2022-02-08 – 2022-02-09 (×2): 100 mg via ORAL
  Filled 2022-02-08 (×2): qty 1

## 2022-02-08 MED ORDER — ONDANSETRON HCL 4 MG/2ML IJ SOLN: 4.0000 mg | INTRAMUSCULAR | Status: DC | PRN

## 2022-02-08 MED ORDER — LACTATED RINGERS IV SOLN: 500.0000 mL | INTRAVENOUS | Status: DC | PRN

## 2022-02-08 NOTE — Discharge Instructions (Signed)
Vaginal Delivery, Care After Refer to this sheet in the next few weeks. These discharge instructions provide you with information on caring for yourself after delivery. Your caregiver may also give you specific instructions. Your treatment has been planned according to the most current medical practices available, but problems sometimes occur. Call your caregiver if you have any problems or questions after you go home. HOME CARE INSTRUCTIONS Take over-the-counter or prescription medicines only as directed by your caregiver or pharmacist. Do not drink alcohol, especially if you are breastfeeding or taking medicine to relieve pain. Do not smoke tobacco. Continue to use good perineal care. Good perineal care includes: Wiping your perineum from back to front Keeping your perineum clean. You can do sitz baths twice a day, to help keep this area clean Do not use tampons, douche or have sex until your caregiver says it is okay. Shower only and avoid sitting in submerged water, aside from sitz baths Wear a well-fitting bra that provides breast support. Eat healthy foods. Drink enough fluids to keep your urine clear or pale yellow. Eat high-fiber foods such as whole grain cereals and breads, brown rice, beans, and fresh fruits and vegetables every day. These foods may help prevent or relieve constipation. Avoid constipation with high fiber foods or medications, such as miralax or metamucil Follow your caregiver's recommendations regarding resumption of activities such as climbing stairs, driving, lifting, exercising, or traveling. Talk to your caregiver about resuming sexual activities. Resumption of sexual activities is dependent upon your risk of infection, your rate of healing, and your comfort and desire to resume sexual activity. Try to have someone help you with your household activities and your newborn for at least a few days after you leave the hospital. Rest as much as possible. Try to rest or  take a nap when your newborn is sleeping. Increase your activities gradually. Keep all of your scheduled postpartum appointments. It is very important to keep your scheduled follow-up appointments. At these appointments, your caregiver will be checking to make sure that you are healing physically and emotionally. SEEK MEDICAL CARE IF:  You are passing large clots from your vagina. Save any clots to show your caregiver. You have a foul smelling discharge from your vagina. You have trouble urinating. You are urinating frequently. You have pain when you urinate. You have a change in your bowel movements. You have increasing redness, pain, or swelling near your vaginal incision (episiotomy) or vaginal tear. You have pus draining from your episiotomy or vaginal tear. Your episiotomy or vaginal tear is separating. You have painful, hard, or reddened breasts. You have a severe headache. You have blurred vision or see spots. You feel sad or depressed. You have thoughts of hurting yourself or your newborn. You have questions about your care, the care of your newborn, or medicines. You are dizzy or light-headed. You have a rash. You have nausea or vomiting. You were breastfeeding and have not had a menstrual period within 12 weeks after you stopped breastfeeding. You are not breastfeeding and have not had a menstrual period by the 12th week after delivery. You have a fever. SEEK IMMEDIATE MEDICAL CARE IF:  You have persistent pain. You have chest pain. You have shortness of breath. You faint. You have leg pain. You have stomach pain. Your vaginal bleeding saturates two or more sanitary pads in 1 hour. MAKE SURE YOU:  Understand these instructions. Will watch your condition. Will get help right away if you are not doing well or   get worse. Document Released: 10/28/2000 Document Revised: 03/17/2014 Document Reviewed: 06/27/2012 ExitCare Patient Information 2015 [employer]. This  information is not intended to replace advice given to you by your health care provider. Make sure you discuss any questions you have with your health care provider.  Sitz Bath A sitz bath is a warm water bath taken in the sitting position. The water covers only the hips and butt (buttocks). We recommend using one that fits in the toilet, to help with ease of use and cleanliness. It may be used for either healing or cleaning purposes. Sitz baths are also used to relieve pain, itching, or muscle tightening (spasms). The water may contain medicine. Moist heat will help you heal and relax.  HOME CARE  Take 3 to 4 sitz baths a day. Fill the bathtub half-full with warm water. Sit in the water and open the drain a little. Turn on the warm water to keep the tub half-full. Keep the water running constantly. Soak in the water for 15 to 20 minutes. After the sitz bath, pat the affected area dry. GET HELP RIGHT AWAY IF: You get worse instead of better. Stop the sitz baths if you get worse. MAKE SURE YOU: Understand these instructions. Will watch your condition. Will get help right away if you are not doing well or get worse. Document Released: 12/08/2004 Document Revised: 07/25/2012 Document Reviewed: 02/28/2011 ExitCare Patient Information 2015 [employer]. This information is not intended to replace advice given to you by your health care provider. Make sure you discuss any questions you have with your health care provider.   

## 2022-02-08 NOTE — Plan of Care (Signed)
Progressing

## 2022-02-08 NOTE — H&P (Signed)
OB History & Physical  ? ?History of Present Illness:  ? ?Chief Complaint: contractions  ? ?HPI:  ?Nichole Little is a 26 y.o. [phone] female at [redacted]w[redacted]d dated by LMP 04/25/2021, c/w [place] at [redacted]w[redacted]d.  She presents to L&D for regular, painful contractions. ? ?Reports active fetal movement  ?Contractions: every 3 to 5 minutes ?LOF/SROM: denies  ?Vaginal bleeding: denies  ? ?Factors complicating pregnancy:  ?Elevated 1hr GTT - 3 hr WNL ? ?Patient Active Problem List  ? Diagnosis Date Noted  ? Post-dates pregnancy 02/08/2022  ? Post term pregnancy at [redacted] weeks gestation 02/07/2022  ? Obesity (BMI 30-39.9) 01/27/2020  ? Encounter for supervision of other normal pregnancy, second trimester 10/31/2019  ? Low grade squamous intraepithelial lesion on cytologic smear of cervix (LGSIL) 06/26/2017  ? ? ? ?Maternal Medical History:  ? ?Past Medical History:  ?Diagnosis Date  ? Medical history non-contributory   ? ? ?Past Surgical History:  ?Procedure Laterality Date  ? NO PAST SURGERIES    ? ? ?Allergies  ?Allergen Reactions  ? Penicillins Hives  ? Metronidazole And Related Hives and Rash  ? ? ?Prior to Admission medications   ?Medication Sig Start Date End Date Taking? Authorizing Provider  ?Prenatal Vit-Fe Fumarate-FA (MULTIVITAMIN-PRENATAL) 27-0.8 MG TABS tablet Take 1 tablet by mouth daily at 12 noon.   Yes [provider]  ?ferrous sulfate 325 (65 FE) MG tablet Take 1 tablet (325 mg total) by mouth daily with breakfast. ?Patient not taking: Reported on 01/04/2022 05/17/20   Genia Del, CNM  ? ? ? ?Prenatal care site:  ?[facility] ? ?Social History: She  reports that she has never smoked. She has never used smokeless tobacco. She reports that she does not drink alcohol and does not use drugs. ? ?Family History: family history includes Asthma in her mother; COPD in her maternal grandmother; Cervical cancer in her mother; Deep vein thrombosis in her maternal grandmother; Diabetes Mellitus II in her maternal  grandmother and mother; Heart attack in her maternal grandmother; Hyperlipidemia in her maternal grandmother; Hypertension in her mother; Kidney disease in her maternal grandmother; Osteoarthritis in her maternal grandmother; Osteoporosis in her maternal grandmother; Skin cancer in her mother; Transient ischemic attack in her maternal grandmother.  ? ?Review of Systems: A full review of systems was performed and negative except as noted in the HPI.   ? ? ?Physical Exam:  ?Vital Signs: BP 112/63 (BP Location: Right Arm)   Pulse 81   Temp 97.7 ?F (36.5 ?C) (Oral)   Resp 20   Ht 5\' 4"  (1.626 m)   Wt 89.8 kg   LMP 04/25/2021   BMI 33.99 kg/m?  ?Physical Exam ? ?General: no acute distress.  ?HEENT: normocephalic, atraumatic ?Heart: regular rate & rhythm.  No murmurs/rubs/gallops ?Lungs: clear to auscultation bilaterally, normal respiratory effort ?Abdomen: soft, gravid, non-tender;  EFW: 7 1/2 to 8 lbs  ?Pelvic:  ? External: Normal external female genitalia ? Cervix: Dilation: 7 / Effacement (%): 100 /   by RN ?  ?Extremities: non-tender, symmetric, No edema bilaterally.  DTRs: 2+/2+  ?Neurologic: Alert & oriented x 3.   ? ?Results for orders placed or performed during the hospital encounter of 02/08/22 (from the past 24 hour(s))  ?CBC     Status: Abnormal  ? Collection Time: 02/08/22  4:18 AM  ?Result Value Ref Range  ? WBC 10.7 (H) 4.0 - 10.5 K/uL  ? RBC 4.26 3.87 - 5.11 MIL/uL  ? Hemoglobin 12.8 12.0 -  15.0 g/dL  ? HCT 38.0 36.0 - 46.0 %  ? MCV 89.2 80.0 - 100.0 fL  ? MCH 30.0 26.0 - 34.0 pg  ? MCHC 33.7 30.0 - 36.0 g/dL  ? RDW 14.2 11.5 - 15.5 %  ? Platelets 145 (L) 150 - 400 K/uL  ? nRBC 0.0 0.0 - 0.2 %  ? ? ?Pertinent Results:  ?Prenatal Labs: ?Blood type/Rh A pos  ?Antibody screen neg  ?Rubella Immune  ?Varicella Immune  ?RPR NR  ?HBsAg Neg  ?HIV NR  ?GC neg  ?Chlamydia neg  ?Genetic screening cfDNA negative  ?1 hour GTT 176  ?3 hour GTT 81, 137, 127, 101  ?GBS Negative   ? ?FHT:  FHR: 130 bpm, variability:  moderate,  accelerations:  Present,  decelerations:  Absent ?Category/reactivity:  Category I ?UC:   regular, every 3-5 minutes ?  ?Cephalic by Leopolds and SVE  ? ?No results found. ? ?Assessment:  ?Nichole Little is a 26 y.o. [phone] female at [redacted]w[redacted]d with active labor.  ? ?Plan:  ?1. Admit to Labor & Delivery; consents reviewed and obtained ?- Covid admission screen  ? ?2. Fetal Well being  ?- Fetal Tracing: cat 1 ?- Group B Streptococcus ppx not indicated: GBS neg ?- Presentation: cephalic confirmed by SVE  ? ?3. Routine OB: ?- Prenatal labs reviewed, as above ?- Rh Pos ?- CBC, T&S, RPR on admit ?- Clear fluids, saline lock ? ?4. Monitoring of labor  ?- Contractions every 3-5 min, monitored with external toco ?- Pelvis proven to 3740g, adequate for trial of labor  ?- Plan for expectant management  ?- Augmentation with oxytocin and AROM as appropriate  ?- Plan for  continuous fetal monitoring ?- Maternal pain control as desired; planning IVPM ?- Anticipate vaginal delivery ? ?5. Post Partum Planning: ?- Infant feeding: breast ?- Contraception: NFP ?- Tdap vaccine: 11/10/2021 ?- Flu vaccine: 09/10/2021 ? ?Gustavo Lah, CNM ?02/08/22 ?4:49 AM ? ?Margaretmary Eddy, CNM ?Certified Nurse Midwife ?[facility] ?[facility]  ? ? ? ?

## 2022-02-08 NOTE — Discharge Summary (Signed)
Obstetrical Discharge Summary ? ?Patient Name: Nichole Little ?DOB: 05/19/1996 ?MRN: [id number] ? ?Date of Admission: 02/08/2022 ?Date of Delivery: 02/08/2022 ?Delivered by: Margaretmary Eddy, CNM  ?Date of Discharge: 02/09/2022 ? ?Primary OB: [facility] ?CLE:[id number]'V last menstrual period was 04/25/2021 (exact date). ?EDC Estimated Date of Delivery: 01/30/22 ?Gestational Age at Delivery: [redacted]w[redacted]d  ? ?Antepartum complications:  ?Elevated 1hr GTT - 3hr WNL ? ?Admitting Diagnosis: Post-dates pregnancy [O48.0]  ?Secondary Diagnosis: ?Patient Active Problem List  ? Diagnosis Date Noted  ? Post-dates pregnancy 02/08/2022  ? Uterine size date discrepancy, third trimester 02/08/2022  ? Post term pregnancy at [redacted] weeks gestation 02/07/2022  ? Encounter for supervision of normal pregnancy in third trimester 07/01/2021  ? Obesity (BMI 30-39.9) 01/27/2020  ? Low grade squamous intraepithelial lesion on cytologic smear of cervix (LGSIL) 06/26/2017  ? ? ?Augmentation: AROM ?Complications: None ?Intrapartum complications/course: Zailey presented to L&D in active labor.  She was augmented with AROM and progressed to 9.5/100/+2 with a strong urge to push.  Anterior lip was easily reduced and she pushed quickly over 9 minutes for a spontaneous vaginal birth.  Brisk bleeding noted just prior to delivery of placenta with several large clots expressed after placenta.  Uterus initially firm with massage with large gushes.  Methergine 0.2mg  IM given.  Uterus became firm with small to moderate lochia.   ?Delivery Type: spontaneous vaginal delivery ?Anesthesia: IVPM ?Placenta: spontaneous ?Laceration: Periurethral and 1st degree vaginal laceration, hemostatic, approximates well, no repair  ?Episiotomy: none ?Newborn Data: ?Live born female "Beau" ?Birth Weight:  4000g ?APGAR: 8, 9 ? ?Newborn Delivery   ?Birth date/time: 02/08/2022 06:29:00 ?Delivery type: Vaginal, Spontaneous ?  ? ? ?Postpartum Procedures:  ?Edinburgh:  ? ?  02/08/2022  ?  9:00  PM 05/17/2020  ?  8:25 AM  ?Edinburgh Postnatal Depression Scale Screening Tool  ?I have been able to laugh and see the funny side of things. 0 0  ?I have looked forward with enjoyment to things. 0 0  ?I have blamed myself unnecessarily when things went wrong. 1 1  ?I have been anxious or worried for no good reason. 0 0  ?I have felt scared or panicky for no good reason. 0 0  ?Things have been getting on top of me. 1 1  ?I have been so unhappy that I have had difficulty sleeping. 0 0  ?I have felt sad or miserable. 0 0  ?I have been so unhappy that I have been crying. 0 0  ?The thought of harming myself has occurred to me. 0 0  ?Edinburgh Postnatal Depression Scale Total 2 2  ?  ? ?Post partum course:  ?Patient had an uncomplicated postpartum course.  By time of discharge on PPD#1, her pain was controlled on oral pain medications; she had appropriate lochia and was ambulating, voiding without difficulty and tolerating regular diet.  She was deemed stable for discharge to home.   ? ?Discharge Physical Exam:  ?BP (!) 104/57 (BP Location: Right Arm)   Pulse 74   Temp 98.1 ?F (36.7 ?C) (Oral)   Resp 12   Ht 5\' 4"  (1.626 m)   Wt 89.8 kg   LMP 04/25/2021 (Exact Date)   SpO2 99%   Breastfeeding Unknown   BMI 33.99 kg/m?  ? ?General: NAD ?CV: RRR ?Pulm: CTABL, nl effort ?ABD: s/nd/nt, fundus firm and below the umbilicus ?Lochia: moderate ?Perineum:minimal edema/laceration hemostatic ?DVT Evaluation: LE non-ttp, no evidence of DVT on exam. ? ?Hemoglobin  ?Date Value Ref Range  Status  ?02/09/2022 10.2 (L) 12.0 - 15.0 g/dL Final  ? ?HGB  ?Date Value Ref Range Status  ?04/11/2014 13.1 12.0 - 16.0 g/dL Final  ? ?HCT  ?Date Value Ref Range Status  ?02/09/2022 29.8 (L) 36.0 - 46.0 % Final  ?04/11/2014 38.4 35.0 - 47.0 % Final  ? ? ? ?Disposition: stable, discharge to home. ?Baby Feeding: breastmilk ?Baby Disposition: home with mom ? ?Rh Immune globulin given: Rh pos ?Rubella vaccine given: Immune ?Varivax vaccine given:  Immune ?Flu vaccine given in AP or PP setting: 09/10/2022 ?Tdap vaccine given in AP or PP setting: 11/10/2022 ? ?Contraception: NFP ? ?Prenatal Labs:  ?Blood type/Rh A pos  ?Antibody screen neg  ?Rubella Immune  ?Varicella Immune  ?RPR NR  ?HBsAg Neg  ?HIV NR  ?GC neg  ?Chlamydia neg  ?Genetic screening cfDNA negative  ?1 hour GTT 176  ?3 hour GTT 81, 137, 127, 101  ?GBS Negative   ? ? ?Plan:  ?Brandice Busser was discharged to home in good condition. ?Follow-up appointment with delivering provider in 6 weeks. ? ?Discharge Medications: ?Allergies as of 02/09/2022   ? ?   Reactions  ? Penicillins Hives  ? Metronidazole And Related Hives, Rash  ? ?  ? ?  ?Medication List  ?  ? ?TAKE these medications   ? ?acetaminophen 500 MG tablet ?Commonly known as: TYLENOL ?Take 2 tablets (1,000 mg total) by mouth every 6 (six) hours as needed (for pain scale < 4). ?  ?benzocaine-Menthol 20-0.5 % Aero ?Commonly known as: DERMOPLAST ?Apply 1 application. topically as needed for irritation (perineal discomfort). ?  ?coconut oil Oil ?Apply 1 application. topically as needed. ?  ?dibucaine 1 % Oint ?Commonly known as: NUPERCAINAL ?Place 1 application. rectally as needed for hemorrhoids. ?  ?docusate sodium 100 MG capsule ?Commonly known as: COLACE ?Take 1 capsule (100 mg total) by mouth 2 (two) times daily. ?  ?ibuprofen 600 MG tablet ?Commonly known as: ADVIL ?Take 1 tablet (600 mg total) by mouth every 6 (six) hours. ?  ?multivitamin-prenatal 27-0.8 MG Tabs tablet ?Take 1 tablet by mouth daily at 12 noon. ?  ?simethicone 80 MG chewable tablet ?Commonly known as: MYLICON ?Chew 1 tablet (80 mg total) by mouth as needed for flatulence. ?  ?witch hazel-glycerin pad ?Commonly known as: TUCKS ?Apply 1 application. topically continuous. ?  ? ?  ? ? ? Follow-up Information   ? ? Gustavo Lah, CNM. Schedule an appointment as soon as possible for a visit in 6 week(s).   ?Specialty: Certified Nurse Midwife ?Why: postpartum visit ?Contact  information: ?[address] ?[place] [address] ?[phone] ? ? ?  ?  ? ? [facility] Follow up in 2 week(s).   ?Why: mood check, hx pp depression ?Contact information: ?[address] ?[place] [address] ?[phone] ? ?  ?  ? ?  ?  ? ?  ? ? ?Signed: ? ?Chari Manning CNM ? ?

## 2022-02-08 NOTE — Lactation Note (Signed)
This note was copied from a baby's chart. ?Lactation Consultation Note ? ?Patient Name: Nichole Little ?Today's Date: 02/08/2022 ?Reason for consult: Initial assessment;Term ?Age:27 hours ? ?When LC entered the room, mom was climbing into the bed and dad holding sleeping baby "Nichole Little".  ? ?Mom stated she breastfed for 1 day with oldest child 4 years ago and 3 weeks with 2nd child almost 2 years ago. She wants to exclusively breastfeed but providing formula to increase blood glucose. Mom reports that baby's blood glucose has been low so she was instructed to give baby "as much formula as he wants".  ? ?LC student explained pumping and hand expression and providing colostrum to baby to improve blood sugar. Mom states she knows how to hand express. Mom stated she does "not want to pump" in the hospital and has a Spectra pump at home.  ? ?LC student discussed hunger cues, feeding frequency, breast stimulation, stomach size, intake/output, paced bottle feeding, normal newborn behavior, clusterfeeding, latching and positioning. Mom stated she provided a pacifier to baby when he was fussy. [facility] student explained avoiding pacifier use until breastfeeding well established.  ? ?Feeding Plan ?1 - Feed baby on demand 8+/24 hour period. Bring baby to breast first then formula (while blood glucose is being monitored).   ?2 - When giving a bottle, make sure to stimulate breasts by pumping or hand expression.  ?3 - Continue skin to skin.  ?4 - Avoid pacifier use until breastfeeding well established.  ?5 - Contact lactation for assessment at next feeding. ?6 - Answered all questions and concerns.  ? ?Maternal Data ?Does the patient have breastfeeding experience prior to this delivery?: Yes ?How long did the patient breastfeed?: 8 day with 27 year old and 3 weeks with 27 year old ? ?Feeding ?Mother's Current Feeding Choice: Breast Milk and Formula ?Nipple Type: Slow - flow ? ?Interventions ?Interventions: Breast feeding basics  reviewed;Education ? ?Discharge ?Pump: Personal (Spectra) ? ?Consult Status ?Consult Status: Follow-up ?Date: 02/09/22 ?Follow-up type: In-patient ? ? ? ?Shana Chute, lactation student ?02/08/2022, 1:32 PM ? ?Lonzo Cloud, RN, IBCLC ? ?

## 2022-02-08 NOTE — Progress Notes (Signed)
Labor Progress Note ? ?Nichole Little is a 26 y.o. [id number] at [redacted]w[redacted]d by LMP admitted for active labor ? ?Subjective: feeling intermittent pressure  ? ?Objective: ?BP 112/63 (BP Location: Right Arm)   Pulse 81   Temp 97.7 ?F (36.5 ?C) (Oral)   Resp 20   Ht 5\' 4"  (1.626 m)   Wt 89.8 kg   LMP 04/25/2021 (Exact Date)   BMI 33.99 kg/m?  ?Notable VS details: reviewed  ? ?Fetal Assessment: ?FHT:  FHR: 130 bpm, variability: moderate,  accelerations:  Present,  decelerations:  Absent ?Category/reactivity:  Category I ?UC:   regular, every 3-4 minutes ?SVE:   8/90/+1 ?Membrane status: AROM at 0532 ?Amniotic color: Clear ? ?Labs: ?Lab Results  ?Component Value Date  ? WBC 10.7 (H) 02/08/2022  ? HGB 12.8 02/08/2022  ? HCT 38.0 02/08/2022  ? MCV 89.2 02/08/2022  ? PLT 145 (L) 02/08/2022  ? ? ?Assessment / Plan: ?Spontaneous labor, progressing normally ?-AROM performed after risk/benefits discussed ? ?Labor: Progressing normally ?Fetal Wellbeing:  Category I ?Pain Control:  IV pain meds ?I/D:  n/a ?Anticipated MOD:  NSVD ? ?Minda Meo, CNM ?02/08/2022, 5:37 AM ? ? ? ? ? ? ? ? ?

## 2022-02-08 NOTE — Progress Notes (Deleted)
Delivery Note ? ?Nichole Little is a [id number] at [redacted]w[redacted]d, Patient's last menstrual period was 04/25/2021 (exact date). , consistent with [place] at [redacted]w[redacted]d.  ? ?First Stage: ?Labor onset: 0000 ?Augmentation: AROM ?Analgesia /Anesthesia intrapartum: IVPM ?AROM at 0532 ?GBS: negative ? ?Second Stage: ?Complete dilation at 0624 ?Onset of pushing at 0620 ?FHR second stage: FHR intermittently monitored during active labor, FHR 120-130's, no decels auscultated  ? ?Nichole Little presented to L&D in active labor.  She was augmented with AROM and progressed to 9.5/100/+2 with a strong urge to push.  Anterior lip was easily reduced and she pushed quickly over 9 minutes for a spontaneous vaginal birth.   ?Delivery of a viable baby boy on 02/08/2022 at 0629 by CNM ?Delivery of fetal head in OA position with restitution to ROT. ?No nuchal cord;  Anterior then posterior shoulders delivered easily with gentle downward traction. Baby placed on mom's chest, and attended to by baby RN ?Cord double clamped after cessation of pulsation, cut by father of baby. ? ?Cord blood sample collection: Not Indicated A POS ? ?Third Stage: ?Oxytocin bolus started after delivery of infant for hemorrhage prophylaxis  ?Placenta delivered intact with 3 VC @ 1837 ?Placenta disposition: discarded  ?Uterine tone firm with massage -> firm/ bleeding large ->moderate  ? ?Brisk bleeding noted just prior to delivery of placenta with several large clots expressed after placenta.  Uterus initially firm with massage with large gushes.  Methergine 0.2mg  IM given.  Uterus became firm with small to moderate lochia.   ? ?Periurethral and 1st degree vaginal laceration identified, hemostatic, approximates well ?Anesthesia for repair: N/A ?Repair not indicated  ?Est. Blood Loss (mL): 700 ml ? ?Complications: None ? ?Mom to postpartum.  Baby to Couplet care / Skin to Skin. ? ?Newborn: ?Information for the patient's newborn:  Nichole Little, Nichole Little [[id number]]  ?Live born female  "Beau" ?Birth Weight:  Pending  ?APGAR: 8, 9 ? ?Newborn Delivery   ?Birth date/time: [birth year]20[birth year] 06:29:00 ?Delivery type: Vaginal, Spontaneous ?  ?  ?Feeding planned: breast ? ?---------- ?Margaretmary Eddy, CNM ?Certified Nurse Midwife ?[facility] ?[facility]  ? ?

## 2022-02-08 NOTE — Progress Notes (Signed)
Intermittent monitoring per CNM verbal order.  ?

## 2022-02-09 LAB — CBC
HCT: 29.8 % — ABNORMAL LOW (ref 36.0–46.0)
Hemoglobin: 10.2 g/dL — ABNORMAL LOW (ref 12.0–15.0)
MCH: 30.4 pg (ref 26.0–34.0)
MCHC: 34.2 g/dL (ref 30.0–36.0)
MCV: 89 fL (ref 80.0–100.0)
Platelets: 157 10*3/uL (ref 150–400)
RBC: 3.35 MIL/uL — ABNORMAL LOW (ref 3.87–5.11)
RDW: 14.3 % (ref 11.5–15.5)
WBC: 11.9 10*3/uL — ABNORMAL HIGH (ref 4.0–10.5)
nRBC: 0 % (ref 0.0–0.2)

## 2022-02-09 MED ORDER — ACETAMINOPHEN 500 MG PO TABS: 1000.0000 mg | ORAL_TABLET | Freq: Four times a day (QID) | ORAL | 0 refills | Status: AC | PRN

## 2022-02-09 MED ORDER — SIMETHICONE 80 MG PO CHEW: 80.0000 mg | CHEWABLE_TABLET | ORAL | 0 refills | Status: AC | PRN

## 2022-02-09 MED ORDER — BENZOCAINE-MENTHOL 20-0.5 % EX AERO: 1.0000 "application " | INHALATION_SPRAY | CUTANEOUS | Status: AC | PRN

## 2022-02-09 MED ORDER — DIBUCAINE (PERIANAL) 1 % EX OINT: 1.0000 "application " | TOPICAL_OINTMENT | CUTANEOUS | Status: AC | PRN

## 2022-02-09 MED ORDER — WITCH HAZEL-GLYCERIN EX PADS: 1.0000 "application " | MEDICATED_PAD | CUTANEOUS | 12 refills | Status: AC

## 2022-02-09 MED ORDER — DOCUSATE SODIUM 100 MG PO CAPS: 100.0000 mg | ORAL_CAPSULE | Freq: Two times a day (BID) | ORAL | 0 refills | Status: AC

## 2022-02-09 MED ORDER — IBUPROFEN 600 MG PO TABS: 600.0000 mg | ORAL_TABLET | Freq: Four times a day (QID) | ORAL | 0 refills | Status: AC

## 2022-02-09 MED ORDER — COCONUT OIL OIL: 1.0000 "application " | TOPICAL_OIL | 0 refills | Status: AC | PRN

## 2022-02-09 NOTE — Progress Notes (Signed)
Patient discharged home with family.  Discharge instructions, when to follow up, and prescriptions reviewed with patient.  Patient verbalized understanding. Patient will be escorted out by auxiliary.
